# Patient Record
Sex: Female | Born: 1995 | Race: Black or African American | Hispanic: No | Marital: Single | Smoking: Never smoker
Health system: Southern US, Community
[De-identification: ages and names within clinical notes are randomized; demographics above are authoritative.]

## PROBLEM LIST (undated history)

## (undated) DIAGNOSIS — G43909 Migraine, unspecified, not intractable, without status migrainosus: Secondary | ICD-10-CM

## (undated) DIAGNOSIS — J45909 Unspecified asthma, uncomplicated: Secondary | ICD-10-CM

## (undated) HISTORY — PX: FRACTURE SURGERY: SHX138

---
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
## 2014-01-01 ENCOUNTER — Telehealth (HOSPITAL_BASED_OUTPATIENT_CLINIC_OR_DEPARTMENT_OTHER): Payer: Self-pay

## 2014-01-01 NOTE — ED Provider Notes (Signed)
9:21 AM Flow manager called regarding polytrim for conjunctivitis. It appears that patient was given this mediation in ED however mother called stating they do not have rx and this was not given to them at discharge. Chart reviewed. Nurse manager to call in rx for Polytrim drops, 1 drop in each eye, q3hrs while awake. Disp 1 bottle.   Renne CriglerJoshua Joss Friedel, PA-C 01/01/14 [phone]

## 2014-01-01 NOTE — Telephone Encounter (Signed)
Pt's mom called she does not have Rx for Polytrim eye drops asked mother if pt came home with a bottle of eye drops from the ED and she stated no only DC paperwork. Spoke w/ Rhea BleacherJosh Geiple PA  And Rx given verbally to call in for pt.  "Polytrim Eye drops 1gtt each eye x 7days, 1 bottle no refills" Rx called to [facility] [phone].

## 2014-01-02 ENCOUNTER — Encounter (HOSPITAL_COMMUNITY): Payer: Self-pay | Admitting: Emergency Medicine

## 2014-01-02 ENCOUNTER — Emergency Department (HOSPITAL_COMMUNITY)
Admission: EM | Admit: 2014-01-02 | Discharge: 2014-01-02 | Disposition: A | Discharge status: Home / Self Care | Payer: Medicaid Other | Attending: Emergency Medicine | Admitting: Emergency Medicine

## 2014-01-02 DIAGNOSIS — Z79899 Other long term (current) drug therapy: Secondary | ICD-10-CM | POA: Insufficient documentation

## 2014-01-02 DIAGNOSIS — H60399 Other infective otitis externa, unspecified ear: Secondary | ICD-10-CM | POA: Insufficient documentation

## 2014-01-02 DIAGNOSIS — G40909 Epilepsy, unspecified, not intractable, without status epilepticus: Secondary | ICD-10-CM | POA: Insufficient documentation

## 2014-01-02 DIAGNOSIS — J45909 Unspecified asthma, uncomplicated: Secondary | ICD-10-CM | POA: Insufficient documentation

## 2014-01-02 DIAGNOSIS — H6090 Unspecified otitis externa, unspecified ear: Secondary | ICD-10-CM

## 2014-01-02 DIAGNOSIS — G43909 Migraine, unspecified, not intractable, without status migrainosus: Secondary | ICD-10-CM | POA: Insufficient documentation

## 2014-01-02 MED ORDER — ACETAMINOPHEN 325 MG PO TABS
650.0000 mg | ORAL_TABLET | Freq: Once | ORAL | Status: AC
  Administered 2014-01-02: 650 mg via ORAL
  Filled 2014-01-02: qty 2

## 2014-01-02 MED ORDER — CIPROFLOXACIN-DEXAMETHASONE 0.3-0.1 % OT SUSP: 4.0000 [drp] | Freq: Two times a day (BID) | OTIC | Status: DC

## 2014-01-02 NOTE — ED Provider Notes (Signed)
CSN: [id number]     Arrival date & time 01/02/14  1737 History   This chart was scribed for Sharilyn SitesLisa Carmell Elgin, PA working with Leonette Mostharles B. Bernette MayersSheldon, MD, by Bronson CurbJacqueline Melvin, ED Scribe. This patient was seen in room WTR8/WTR8 and the patient's care was started at 5:46 PM.    Chief Complaint  Patient presents with  . Otalgia    left     The history is provided by the patient. No language interpreter was used.   HPI Comments: Shelby Pittman is a 18 y.o. female who presents to the Emergency Department complaining of left ear pain that began 2 days ago. Patient states she was seen here 2 days ago for left ear pain, and was given ear drops (auralgan) with no relief. Patient states she feels as if her left ear is "clogged".  States there is some associated ear drainage-- states it looked like pus.  Denies fevers or chills.  States her hearing seems a bit muffled, but largely normal.    Past Medical History  Diagnosis Date  . Asthma   . Migraine   . Seizures    Past Surgical History  Procedure Laterality Date  . Fracture surgery     Family History  Problem Relation Age of Onset  . Hypertension Mother   . Hypertension Other   . Diabetes Other    History  Substance Use Topics  . Smoking status: Never Smoker   . Smokeless tobacco: Not on file  . Alcohol Use: No   OB History   Grav Para Term Preterm Abortions TAB SAB Ect Mult Living                 Review of Systems  Constitutional: Negative for fever.  HENT: Positive for ear discharge, ear pain and hearing loss.   All other systems reviewed and are negative.     Allergies  Review of patient's allergies indicates no known allergies.  Home Medications   Prior to Admission medications   Medication Sig Start Date End Date Taking? Authorizing Provider  albuterol (PROVENTIL HFA;VENTOLIN HFA) 108 (90 BASE) MCG/ACT inhaler Inhale 2 puffs into the lungs every 6 (six) hours as needed for wheezing.    Historical Provider, MD   antipyrine-benzocaine Lyla Son(AURALGAN) otic solution Place 3-4 drops into the left ear every 2 (two) hours as needed for ear pain. 12/31/13   Roxy Horsemanobert Browning, PA-C  Bepotastine Besilate (BEPREVE) 1.5 % SOLN Place 1 drop into both eyes daily.    Historical Provider, MD  budesonide-formoterol (SYMBICORT) 160-4.5 MCG/ACT inhaler Inhale 2 puffs into the lungs every evening.    Historical Provider, MD  cetirizine (ZYRTEC) 10 MG tablet Take 10 mg by mouth every evening.     Historical Provider, MD  etonogestrel (IMPLANON) 68 MG IMPL implant Inject 1 each into the skin once. Received implant 01/2013    Historical Provider, MD  imipramine (TOFRANIL) 25 MG tablet Take 75 mg by mouth at bedtime.    Historical Provider, MD  levocetirizine (XYZAL) 5 MG tablet Take 5 mg by mouth every evening.    Historical Provider, MD  montelukast (SINGULAIR) 10 MG tablet Take 10 mg by mouth at bedtime.    Historical Provider, MD  oxymetazoline (AFRIN NASAL SPRAY) 0.05 % nasal spray Place 2 sprays into the nose 2 (two) times daily. 11/03/12   Renne CriglerJoshua Geiple, PA-C  topiramate (TOPAMAX SPRINKLE) 25 MG capsule Take 100 mg by mouth 2 (two) times daily.     Historical Provider, MD  Triage Vitals: BP 118/76  Pulse 72  Temp(Src) 98.6 F (37 C) (Oral)  Resp 16  Ht 4\' 11"  (1.499 m)  Wt 130 lb (58.968 kg)  BMI 26.24 kg/m2  SpO2 100%  Physical Exam  Nursing note and vitals reviewed. Constitutional: She is oriented to person, place, and time. She appears well-developed and well-nourished. No distress.  HENT:  Head: Normocephalic and atraumatic.  Right Ear: Tympanic membrane and ear canal normal.  Left Ear: Tympanic membrane normal.  Nose: Nose normal.  Mouth/Throat: Oropharynx is clear and moist.  Left EAC erythematous and mildly swollen without drainage present; TM normal without signs of perforation; no mastoid tenderness; no facial swelling; no notable hearing loss  Eyes: Conjunctivae and EOM are normal. Pupils are equal, round,  and reactive to light.  Neck: Normal range of motion. Neck supple.  Cardiovascular: Normal rate, regular rhythm and normal heart sounds.   Pulmonary/Chest: Effort normal and breath sounds normal. No respiratory distress. She has no wheezes.  Musculoskeletal: Normal range of motion.  Neurological: She is alert and oriented to person, place, and time.  Skin: Skin is warm and dry. She is not diaphoretic.  Psychiatric: She has a normal mood and affect.    ED Course  Procedures (including critical care time)  DIAGNOSTIC STUDIES: Oxygen Saturation is 98% on room air, normal by my interpretation.    COORDINATION OF CARE: At 1748 Discussed treatment plan with patient which includes antibiotic drops. Patient agrees.   Labs Review Labs Reviewed - No data to display  Imaging Review No results found.   EKG Interpretation None      MDM   Final diagnoses:  Otitis externa   PE findings consistent with OE, TM normal.  Hearing WNL on my exam.  Will start on ciprodex.  Instructed she may continue auralgan drops, tylenol/motrin for comfort.  Discussed plan with patient, he/she acknowledged understanding and agreed with plan of care.  Return precautions given for new or worsening symptoms.  I personally performed the services described in this documentation, which was scribed in my presence. The recorded information has been reviewed and is accurate.  Garlon HatchetLisa M Jahiem Franzoni, PA-C 01/02/14 [id number]

## 2014-01-02 NOTE — Discharge Instructions (Signed)
Take the prescribed medication as directed.  May continue using benzocaine drops for comfort. Follow-up with your primary care physician.  If you do not have a primary care physician see resource guide to help find one. Return to the ED for new or worsening symptoms.

## 2014-01-02 NOTE — ED Provider Notes (Signed)
Medical screening examination/treatment/procedure(s) were performed by non-physician practitioner and as supervising physician I was immediately available for consultation/collaboration.   EKG Interpretation None        Charles B. Bernette MayersSheldon, MD 01/02/14 2157
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
## 2014-01-05 NOTE — ED Provider Notes (Signed)
Medical screening examination/treatment/procedure(s) were performed by non-physician practitioner and as supervising physician I was immediately available for consultation/collaboration.   EKG Interpretation None        Audree CamelScott T Tiwana Chavis, MD 01/05/14 1650

## 2014-04-19 ENCOUNTER — Encounter (HOSPITAL_COMMUNITY): Payer: Self-pay | Admitting: Emergency Medicine

## 2014-04-19 ENCOUNTER — Emergency Department (HOSPITAL_COMMUNITY)
Admission: EM | Admit: 2014-04-19 | Discharge: 2014-04-20 | Disposition: A | Discharge status: Home / Self Care | Payer: Medicaid Other | Attending: Emergency Medicine | Admitting: Emergency Medicine

## 2014-04-19 DIAGNOSIS — S5010XA Contusion of unspecified forearm, initial encounter: Secondary | ICD-10-CM | POA: Insufficient documentation

## 2014-04-19 DIAGNOSIS — T71163A Asphyxiation due to hanging, assault, initial encounter: Secondary | ICD-10-CM | POA: Insufficient documentation

## 2014-04-19 DIAGNOSIS — S1093XA Contusion of unspecified part of neck, initial encounter: Secondary | ICD-10-CM | POA: Diagnosis not present

## 2014-04-19 DIAGNOSIS — S0083XA Contusion of other part of head, initial encounter: Secondary | ICD-10-CM | POA: Insufficient documentation

## 2014-04-19 DIAGNOSIS — G43909 Migraine, unspecified, not intractable, without status migrainosus: Secondary | ICD-10-CM | POA: Diagnosis not present

## 2014-04-19 DIAGNOSIS — G40909 Epilepsy, unspecified, not intractable, without status epilepticus: Secondary | ICD-10-CM | POA: Insufficient documentation

## 2014-04-19 DIAGNOSIS — S0990XA Unspecified injury of head, initial encounter: Secondary | ICD-10-CM | POA: Diagnosis present

## 2014-04-19 DIAGNOSIS — S40019A Contusion of unspecified shoulder, initial encounter: Secondary | ICD-10-CM | POA: Diagnosis not present

## 2014-04-19 DIAGNOSIS — S0003XA Contusion of scalp, initial encounter: Secondary | ICD-10-CM | POA: Diagnosis not present

## 2014-04-19 DIAGNOSIS — Z79899 Other long term (current) drug therapy: Secondary | ICD-10-CM | POA: Diagnosis not present

## 2014-04-19 DIAGNOSIS — J45909 Unspecified asthma, uncomplicated: Secondary | ICD-10-CM | POA: Insufficient documentation

## 2014-04-19 DIAGNOSIS — S40029A Contusion of unspecified upper arm, initial encounter: Secondary | ICD-10-CM | POA: Insufficient documentation

## 2014-04-19 DIAGNOSIS — S40021A Contusion of right upper arm, initial encounter: Secondary | ICD-10-CM
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
## 2014-04-19 NOTE — ED Notes (Signed)
[employer] spoke with patient in reference to legal actions to take.
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
## 2014-04-20 MED ORDER — IBUPROFEN 200 MG PO TABS
600.0000 mg | ORAL_TABLET | Freq: Once | ORAL | Status: DC
  Filled 2014-04-20: qty 3

## 2014-04-20 MED ORDER — TRAMADOL HCL 50 MG PO TABS
50.0000 mg | ORAL_TABLET | Freq: Once | ORAL | Status: DC
  Filled 2014-04-20: qty 1

## 2014-04-20 MED ORDER — IBUPROFEN 100 MG/5ML PO SUSP
600.0000 mg | Freq: Once | ORAL | Status: AC
  Administered 2014-04-20: 600 mg via ORAL
  Filled 2014-04-20: qty 30
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
## 2014-04-20 NOTE — Discharge Instructions (Signed)
Contusion °A contusion is a deep bruise. Contusions are the result of an injury that caused bleeding under the skin. The contusion may turn blue, purple, or yellow. Minor injuries will give you a painless contusion, but more severe contusions may stay painful and swollen for a few weeks.  °CAUSES  °A contusion is usually caused by a blow, trauma, or direct force to an area of the body. °SYMPTOMS  °· Swelling and redness of the injured area. °· Bruising of the injured area. °· Tenderness and soreness of the injured area. °· Pain. °DIAGNOSIS  °The diagnosis can be made by taking a history and physical exam. An X-ray, CT scan, or MRI may be needed to determine if there were any associated injuries, such as fractures. °TREATMENT  °Specific treatment will depend on what area of the body was injured. In general, the best treatment for a contusion is resting, icing, elevating, and applying cold compresses to the injured area. Over-the-counter medicines may also be recommended for pain control. Ask your caregiver what the best treatment is for your contusion. °HOME CARE INSTRUCTIONS  °· Put ice on the injured area. °· Put ice in a plastic bag. °· Place a towel between your skin and the bag. °· Leave the ice on for 15-20 minutes, 3-4 times a day, or as directed by your health care provider. °· Only take over-the-counter or prescription medicines for pain, discomfort, or fever as directed by your caregiver. Your caregiver may recommend avoiding anti-inflammatory medicines (aspirin, ibuprofen, and naproxen) for 48 hours because these medicines may increase bruising. °· Rest the injured area. °· If possible, elevate the injured area to reduce swelling. °SEEK IMMEDIATE MEDICAL CARE IF:  °· You have increased bruising or swelling. °· You have pain that is getting worse. °· Your swelling or pain is not relieved with medicines. °MAKE SURE YOU:  °· Understand these instructions. °· Will watch your condition. °· Will get help right  away if you are not doing well or get worse. °Document Released: 05/24/2005 Document Revised: 08/19/2013 Document Reviewed: 06/19/2011 °ExitCare® Patient Information ©2015 [employer]. This information is not intended to replace advice given to you by your health care provider. Make sure you discuss any questions you have with your health care provider. ° ° °Head Injury °You have received a head injury. It does not appear serious at this time. Headaches and vomiting are common following head injury. It should be easy to awaken from sleeping. Sometimes it is necessary for you to stay in the emergency department for a while for observation. Sometimes admission to the hospital may be needed. After injuries such as yours, most problems occur within the first 24 hours, but side effects may occur up to 7-10 days after the injury. It is important for you to carefully monitor your condition and contact your health care provider or seek immediate medical care if there is a change in your condition. °WHAT ARE THE TYPES OF HEAD INJURIES? °Head injuries can be as minor as a bump. Some head injuries can be more severe. More severe head injuries include: °· A jarring injury to the brain (concussion). °· A bruise of the brain (contusion). This mean there is bleeding in the brain that can cause swelling. °· A cracked skull (skull fracture). °· Bleeding in the brain that collects, clots, and forms a bump (hematoma). °WHAT CAUSES A HEAD INJURY? °A serious head injury is most likely to happen to someone who is in a car wreck and is not   wearing a seat belt. Other causes of major head injuries include bicycle or motorcycle accidents, sports injuries, and falls. °HOW ARE HEAD INJURIES DIAGNOSED? °A complete history of the event leading to the injury and your current symptoms will be helpful in diagnosing head injuries. Many times, pictures of the brain, such as CT or MRI are needed to see the extent of the injury. Often, an overnight  hospital stay is necessary for observation.  °WHEN SHOULD I SEEK IMMEDIATE MEDICAL CARE?  °You should get help right away if: °· You have confusion or drowsiness. °· You feel sick to your stomach (nauseous) or have continued, forceful vomiting. °· You have dizziness or unsteadiness that is getting worse. °· You have severe, continued headaches not relieved by medicine. Only take over-the-counter or prescription medicines for pain, fever, or discomfort as directed by your health care provider. °· You do not have normal function of the arms or legs or are unable to walk. °· You notice changes in the black spots in the center of the colored part of your eye (pupil). °· You have a clear or bloody fluid coming from your nose or ears. °· You have a loss of vision. °During the next 24 hours after the injury, you must stay with someone who can watch you for the warning signs. This person should contact local emergency services (911 in the [place] if you have seizures, you become unconscious, or you are unable to wake up. °HOW CAN I PREVENT A HEAD INJURY IN THE FUTURE? °The most important factor for preventing major head injuries is avoiding motor vehicle accidents.  To minimize the potential for damage to your head, it is crucial to wear seat belts while riding in motor vehicles. Wearing helmets while bike riding and playing collision sports (like football) is also helpful. Also, avoiding dangerous activities around the house will further help reduce your risk of head injury.  °WHEN CAN I RETURN TO NORMAL ACTIVITIES AND ATHLETICS? °You should be reevaluated by your health care provider before returning to these activities. If you have any of the following symptoms, you should not return to activities or contact sports until 1 week after the symptoms have stopped: °· Persistent headache. °· Dizziness or vertigo. °· Poor attention and concentration. °· Confusion. °· Memory problems. °· Nausea or vomiting. °· Fatigue or tire  easily. °· Irritability. °· Intolerant of bright lights or loud noises. °· Anxiety or depression. °· Disturbed sleep. °MAKE SURE YOU:  °· Understand these instructions. °· Will watch your condition. °· Will get help right away if you are not doing well or get worse. °Document Released: 08/14/2005 Document Revised: 08/19/2013 Document Reviewed: 04/21/2013 °ExitCare® Patient Information ©2015 [employer]. This information is not intended to replace advice given to you by your health care provider. Make sure you discuss any questions you have with your health care provider. ° °

## 2014-10-11 ENCOUNTER — Emergency Department (HOSPITAL_COMMUNITY)
Admission: EM | Admit: 2014-10-11 | Discharge: 2014-10-11 | Discharge status: Absent without leave | Payer: No Typology Code available for payment source

## 2015-03-17 ENCOUNTER — Encounter (HOSPITAL_COMMUNITY): Payer: Self-pay | Admitting: Emergency Medicine

## 2015-03-17 ENCOUNTER — Emergency Department (HOSPITAL_COMMUNITY)
Admission: EM | Admit: 2015-03-17 | Discharge: 2015-03-17 | Disposition: A | Discharge status: Home / Self Care | Payer: Medicaid Other | Attending: Emergency Medicine | Admitting: Emergency Medicine

## 2015-03-17 ENCOUNTER — Emergency Department (HOSPITAL_COMMUNITY): Payer: Medicaid Other

## 2015-03-17 ENCOUNTER — Emergency Department (HOSPITAL_COMMUNITY): Payer: Self-pay

## 2015-03-17 DIAGNOSIS — Y9389 Activity, other specified: Secondary | ICD-10-CM | POA: Insufficient documentation

## 2015-03-17 DIAGNOSIS — Z7951 Long term (current) use of inhaled steroids: Secondary | ICD-10-CM | POA: Insufficient documentation

## 2015-03-17 DIAGNOSIS — G43909 Migraine, unspecified, not intractable, without status migrainosus: Secondary | ICD-10-CM | POA: Insufficient documentation

## 2015-03-17 DIAGNOSIS — J45909 Unspecified asthma, uncomplicated: Secondary | ICD-10-CM | POA: Insufficient documentation

## 2015-03-17 DIAGNOSIS — S79912A Unspecified injury of left hip, initial encounter: Secondary | ICD-10-CM | POA: Insufficient documentation

## 2015-03-17 DIAGNOSIS — R52 Pain, unspecified: Secondary | ICD-10-CM

## 2015-03-17 DIAGNOSIS — Y998 Other external cause status: Secondary | ICD-10-CM | POA: Insufficient documentation

## 2015-03-17 DIAGNOSIS — S29001A Unspecified injury of muscle and tendon of front wall of thorax, initial encounter: Secondary | ICD-10-CM | POA: Insufficient documentation

## 2015-03-17 DIAGNOSIS — Z79899 Other long term (current) drug therapy: Secondary | ICD-10-CM | POA: Insufficient documentation

## 2015-03-17 DIAGNOSIS — Y92410 Unspecified street and highway as the place of occurrence of the external cause: Secondary | ICD-10-CM | POA: Insufficient documentation

## 2015-03-17 LAB — CBC
HCT: 42.2 % (ref 36.0–46.0)
Hemoglobin: 13.8 g/dL (ref 12.0–15.0)
MCH: 29.9 pg (ref 26.0–34.0)
MCHC: 32.7 g/dL (ref 30.0–36.0)
MCV: 91.3 fL (ref 78.0–100.0)
Platelets: 249 10*3/uL (ref 150–400)
RBC: 4.62 MIL/uL (ref 3.87–5.11)
RDW: 12.9 % (ref 11.5–15.5)
WBC: 5.9 10*3/uL (ref 4.0–10.5)

## 2015-03-17 LAB — BASIC METABOLIC PANEL
Anion gap: 9 (ref 5–15)
BUN: 13 mg/dL (ref 6–20)
CO2: 27 mmol/L (ref 22–32)
CREATININE: 0.92 mg/dL (ref 0.44–1.00)
Calcium: 9.3 mg/dL (ref 8.9–10.3)
Chloride: 105 mmol/L (ref 101–111)
GFR calc non Af Amer: >60 mL/min (ref >60)
Glucose, Bld: 62 mg/dL — ABNORMAL LOW (ref 65–99)
POTASSIUM: 3.9 mmol/L (ref 3.5–5.1)
Sodium: 141 mmol/L (ref 135–145)

## 2015-03-17 MED ORDER — OXYCODONE-ACETAMINOPHEN 5-325 MG PO TABS: 2.0000 | ORAL_TABLET | ORAL | Status: DC | PRN

## 2015-03-17 MED ORDER — METHOCARBAMOL 500 MG PO TABS: 500.0000 mg | ORAL_TABLET | Freq: Two times a day (BID) | ORAL | Status: DC
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
## 2015-03-17 NOTE — ED Notes (Signed)
Patient transported to X-ray 
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
## 2015-03-17 NOTE — ED Notes (Signed)
Pt tried to use restroom but could not go.

## 2015-03-17 NOTE — Discharge Instructions (Signed)

## 2016-06-15 ENCOUNTER — Encounter (HOSPITAL_COMMUNITY): Payer: Self-pay | Admitting: Emergency Medicine

## 2016-06-15 ENCOUNTER — Emergency Department (HOSPITAL_COMMUNITY)
Admission: EM | Admit: 2016-06-15 | Discharge: 2016-06-15 | Disposition: A | Discharge status: Home / Self Care | Payer: Medicaid Other | Attending: Emergency Medicine | Admitting: Emergency Medicine

## 2016-06-15 DIAGNOSIS — J069 Acute upper respiratory infection, unspecified: Secondary | ICD-10-CM | POA: Insufficient documentation

## 2016-06-15 DIAGNOSIS — J45909 Unspecified asthma, uncomplicated: Secondary | ICD-10-CM | POA: Insufficient documentation

## 2016-06-15 MED ORDER — PROMETHAZINE-DM 6.25-15 MG/5ML PO SYRP: 5.0000 mL | ORAL_SOLUTION | Freq: Four times a day (QID) | ORAL | 0 refills | Status: DC | PRN

## 2016-06-15 MED ORDER — GUAIFENESIN 100 MG/5ML PO LIQD
100.0000 mg | Freq: Four times a day (QID) | ORAL | 0 refills | Status: DC | PRN
Start: 2016-06-15 — End: 2017-03-13

## 2016-06-15 NOTE — ED Triage Notes (Signed)
Pt reports runny nose, chest congestion, hot/cold flashes, and weakness for the past week. Ambulatory to triage.

## 2016-06-15 NOTE — ED Provider Notes (Signed)
[facility]-EMERGENCY DEPT Provider Note   CSN: [id number] Arrival date & time: 06/15/16  1028     History   Chief Complaint Chief Complaint  Patient presents with  . Nasal Congestion    HPI Shelby Pittman is a 20 y.o. female.  HPI   20 year old female with history of seizure, asthma, migraine presenting with cold symptoms. Patient report for the past 6 days she has had headache, nasal congestion, sore throat, ear pain, sneezing, coughing, decrease in appetite, body aches. 4 days ago she vomited 3 times of nonbloody nonbilious vomitus and has some loose stools but that has since resolved. The symptom is improving. She has been trying over-the-counter medication with some improvement. Currently she denies having any fever, chills.  No recent sick contact.    Past Medical History:  Diagnosis Date  . Asthma   . Migraine   . Seizures (HCC)     There are no active problems to display for this patient.   Past Surgical History:  Procedure Laterality Date  . FRACTURE SURGERY      OB History    No data available       Home Medications    Prior to Admission medications   Medication Sig Start Date End Date Taking? Authorizing Provider  albuterol (PROVENTIL HFA;VENTOLIN HFA) 108 (90 BASE) MCG/ACT inhaler Inhale 2 puffs into the lungs every 6 (six) hours as needed for wheezing.    Historical Provider, MD  Bepotastine Besilate (BEPREVE) 1.5 % SOLN Place 1 drop into both eyes daily.    Historical Provider, MD  budesonide-formoterol (SYMBICORT) 160-4.5 MCG/ACT inhaler Inhale 2 puffs into the lungs every evening.    Historical Provider, MD  etonogestrel (IMPLANON) 68 MG IMPL implant Inject 1 each into the skin once. Received implant 01/2013    Historical Provider, MD  imipramine (TOFRANIL) 25 MG tablet Take 75 mg by mouth at bedtime.    Historical Provider, MD  methocarbamol (ROBAXIN) 500 MG tablet Take 1 tablet (500 mg total) by mouth 2 (two) times daily. 03/17/15   Lorre Nick, MD    oxyCODONE-acetaminophen (PERCOCET/ROXICET) 5-325 MG per tablet Take 2 tablets by mouth every 4 (four) hours as needed for severe pain. 03/17/15   Lorre Nick, MD  topiramate (TOPAMAX SPRINKLE) 25 MG capsule Take 100 mg by mouth 2 (two) times daily.     Historical Provider, MD    Family History Family History  Problem Relation Age of Onset  . Hypertension Mother   . Hypertension Other   . Diabetes Other     Social History Social History  Substance Use Topics  . Smoking status: Never Smoker  . Smokeless tobacco: Not on file  . Alcohol use No     Allergies   Review of patient's allergies indicates no known allergies.   Review of Systems Review of Systems  All other systems reviewed and are negative.    Physical Exam Updated Vital Signs BP 126/77   Pulse (!) 52   Temp 97.7 F (36.5 C) (Oral)   Resp 16   SpO2 100%   Physical Exam  Constitutional: She appears well-developed and well-nourished. No distress.  HENT:  Head: Atraumatic.  Right Ear: External ear normal.  Left Ear: External ear normal.  Nose: Nose normal.  Mouth/Throat: Oropharynx is clear and moist.  Eyes: Conjunctivae are normal.  Neck: Normal range of motion. Neck supple.  No nuchal rigidity  Cardiovascular: Normal rate, regular rhythm and intact distal pulses.   Pulmonary/Chest: Effort normal and  breath sounds normal.  Abdominal: Soft. Bowel sounds are normal. She exhibits no distension. There is no tenderness.  Lymphadenopathy:    She has no cervical adenopathy.  Neurological: She is alert.  Skin: No rash noted.  Psychiatric: She has a normal mood and affect.  Nursing note and vitals reviewed.    ED Treatments / Results  Labs (all labs ordered are listed, but only abnormal results are displayed) Labs Reviewed - No data to display  EKG  EKG Interpretation None       Radiology No results found.  Procedures Procedures (including critical care time)  Medications Ordered in  ED Medications - No data to display   Initial Impression / Assessment and Plan / ED Course  I have reviewed the triage vital signs and the nursing notes.  Pertinent labs & imaging results that were available during my care of the patient were reviewed by me and considered in my medical decision making (see chart for details).  Clinical Course    BP 126/77   Pulse (!) 52   Temp 97.7 F (36.5 C) (Oral)   Resp 16   SpO2 100%    Final Clinical Impressions(s) / ED Diagnoses   Final diagnoses:  Acute upper respiratory infection    New Prescriptions Discharge Medication List as of 06/15/2016 12:23 PM    START taking these medications   Details  guaiFENesin (ROBITUSSIN) 100 MG/5ML liquid Take 5-10 mLs (100-200 mg total) by mouth 4 (four) times daily as needed for congestion., Starting Thu 06/15/2016, Print    promethazine-dextromethorphan (PROMETHAZINE-DM) 6.25-15 MG/5ML syrup Take 5 mLs by mouth 4 (four) times daily as needed for cough., Starting Thu 06/15/2016, Print       12:03 PM Pt with sxs suggestive of URI.  No concerning finding. Will provide sxs treatment. Return precaution discussed.     Fayrene HelperBowie Mackinley Cassaday, PA-C 06/15/16 2051    Canary Brimhristopher J Tegeler, MD 06/15/16 2104

## 2016-06-15 NOTE — ED Notes (Signed)
PA at bedside.

## 2016-06-15 NOTE — Progress Notes (Signed)
[facility]  Internal Medicine [phone] [address] [id number] [facility] [place] [id number]    Next Steps: Go on 07/24/2016    Instructions: You have a scheduled appointment to establish care with Dr Reymundo Pollarolyn Guilloud on 07/24/16 at 3:15 pm

## 2016-07-24 ENCOUNTER — Encounter: Payer: Medicaid Other | Admitting: Internal Medicine

## 2016-09-08 ENCOUNTER — Emergency Department (HOSPITAL_COMMUNITY)
Admission: EM | Admit: 2016-09-08 | Discharge: 2016-09-08 | Disposition: A | Discharge status: Home / Self Care | Payer: Self-pay | Attending: Emergency Medicine | Admitting: Emergency Medicine

## 2016-09-08 ENCOUNTER — Encounter (HOSPITAL_COMMUNITY): Payer: Self-pay | Admitting: Emergency Medicine

## 2016-09-08 ENCOUNTER — Emergency Department (HOSPITAL_COMMUNITY): Payer: Self-pay

## 2016-09-08 DIAGNOSIS — J45909 Unspecified asthma, uncomplicated: Secondary | ICD-10-CM | POA: Insufficient documentation

## 2016-09-08 DIAGNOSIS — Z79899 Other long term (current) drug therapy: Secondary | ICD-10-CM | POA: Insufficient documentation

## 2016-09-08 DIAGNOSIS — R05 Cough: Secondary | ICD-10-CM | POA: Insufficient documentation

## 2016-09-08 DIAGNOSIS — J029 Acute pharyngitis, unspecified: Secondary | ICD-10-CM | POA: Insufficient documentation

## 2016-09-08 DIAGNOSIS — R059 Cough, unspecified: Secondary | ICD-10-CM

## 2016-09-08 LAB — RAPID STREP SCREEN (MED CTR MEBANE ONLY): Streptococcus, Group A Screen (Direct): NEGATIVE

## 2016-09-08 MED ORDER — IPRATROPIUM-ALBUTEROL 0.5-2.5 (3) MG/3ML IN SOLN
3.0000 mL | Freq: Once | RESPIRATORY_TRACT | Status: AC
  Administered 2016-09-08: 3 mL via RESPIRATORY_TRACT
  Filled 2016-09-08: qty 3

## 2016-09-08 NOTE — ED Notes (Signed)
Unable to obtain discharge vitals pt ready to leave.

## 2016-09-08 NOTE — ED Triage Notes (Signed)
Pt reports swelling and sharp pain to left lower gingiva, reports swelling into left submandibular area, sore throat, painful swallowing.   Tonsils 2+ without exudate. Coarse lung sounds to left upper lobe.  Pt reports recent recovery from cough, yellow nasal drainage persisting.

## 2016-09-08 NOTE — ED Provider Notes (Signed)
[facility]-EMERGENCY DEPT Provider Note   CSN: [id number] Arrival date & time: 09/08/16  1225  By signing my name below, I, Modena Jansky, attest that this documentation has been prepared under the direction and in the presence of non-physician practitioner, Mathews Robinsons, PA-C. Electronically Signed: Modena Jansky, Scribe. 09/08/2016. 1:35 PM.  History   Chief Complaint Chief Complaint  Patient presents with  . Sore Throat   The history is provided by the patient. No language interpreter was used.   HPI Comments: Shelby Pittman is a 21 y.o. female with a PMHx of asthma who presents to the Emergency Department complaining of constant moderate sublingual gum pain that started about 4 days ago. She states recently had a URI with unresolved cough a month ago (no CXR done then), and she recently had a gradual onset of gum pain. Her pain is relieved by massaging area with tongue and exacerbated by talking and swallowing. No treatment PTA. She has associated facial swelling underneath chin. She denies any prior hx of similar complaint, fever, chills, sinus pain, dental pain, SOB, cough, nausea, vomiting, dizziness, or visual disturbance.     PCP: Reymundo Poll, MD  Past Medical History:  Diagnosis Date  . Asthma   . Migraine   . Seizures (HCC)     There are no active problems to display for this patient.   Past Surgical History:  Procedure Laterality Date  . FRACTURE SURGERY      OB History    No data available       Home Medications    Prior to Admission medications   Medication Sig Start Date End Date Taking? Authorizing Provider  albuterol (PROVENTIL HFA;VENTOLIN HFA) 108 (90 BASE) MCG/ACT inhaler Inhale 2 puffs into the lungs every 6 (six) hours as needed for wheezing.    Historical Provider, MD  Bepotastine Besilate (BEPREVE) 1.5 % SOLN Place 1 drop into both eyes daily.    Historical Provider, MD  budesonide-formoterol (SYMBICORT) 160-4.5 MCG/ACT inhaler Inhale 2  puffs into the lungs every evening.    Historical Provider, MD  etonogestrel (IMPLANON) 68 MG IMPL implant Inject 1 each into the skin once. Received implant 01/2013    Historical Provider, MD  guaiFENesin (ROBITUSSIN) 100 MG/5ML liquid Take 5-10 mLs (100-200 mg total) by mouth 4 (four) times daily as needed for congestion. 06/15/16   Fayrene Helper, PA-C  imipramine (TOFRANIL) 25 MG tablet Take 75 mg by mouth at bedtime.    Historical Provider, MD  methocarbamol (ROBAXIN) 500 MG tablet Take 1 tablet (500 mg total) by mouth 2 (two) times daily. 03/17/15   Lorre Nick, MD  oxyCODONE-acetaminophen (PERCOCET/ROXICET) 5-325 MG per tablet Take 2 tablets by mouth every 4 (four) hours as needed for severe pain. 03/17/15   Lorre Nick, MD  promethazine-dextromethorphan (PROMETHAZINE-DM) 6.25-15 MG/5ML syrup Take 5 mLs by mouth 4 (four) times daily as needed for cough. 06/15/16   Fayrene Helper, PA-C  topiramate (TOPAMAX SPRINKLE) 25 MG capsule Take 100 mg by mouth 2 (two) times daily.     Historical Provider, MD    Family History Family History  Problem Relation Age of Onset  . Hypertension Mother   . Hypertension Other   . Diabetes Other     Social History Social History  Substance Use Topics  . Smoking status: Never Smoker  . Smokeless tobacco: Not on file  . Alcohol use No     Allergies   Patient has no known allergies.   Review of Systems Review of  Systems  Constitutional: Negative for chills and fever.  HENT: Positive for facial swelling (Under chin) and sore throat. Negative for dental problem, ear pain, mouth sores and sinus pain.        Patient reports sublingual tenderness and swelling under her chin.  Eyes: Negative for pain and visual disturbance.  Respiratory: Positive for cough and wheezing. Negative for chest tightness, shortness of breath and stridor.   Gastrointestinal: Negative for abdominal distention, abdominal pain, nausea and vomiting.  Musculoskeletal: Negative for gait  problem, myalgias, neck pain and neck stiffness.  Skin: Negative for color change, pallor, rash and wound.  Neurological: Negative for dizziness, syncope, facial asymmetry, speech difficulty, weakness and numbness.     Physical Exam Updated Vital Signs BP 119/83 (BP Location: Right Arm)   Pulse (!) 58   Temp 97.7 F (36.5 C) (Oral)   Resp 16   SpO2 100%   Physical Exam  Constitutional: She appears well-developed and well-nourished. No distress.  Nontoxic appearing in no acute distress, sitting comfortably in bed  HENT:  Head: Normocephalic and atraumatic.  Mouth/Throat: Oropharynx is clear and moist. No oropharyngeal exudate.  Sublingual area is raised. TTP on the left sublingual and gums in the front. Swelling noted underneath chin.   Eyes: EOM are normal. Pupils are equal, round, and reactive to light. Right eye exhibits no discharge. Left eye exhibits no discharge.  Neck: Normal range of motion. Neck supple. No tracheal deviation present.  Cardiovascular: Normal rate, regular rhythm, normal heart sounds and intact distal pulses.   Pulmonary/Chest: Effort normal. No respiratory distress. She has wheezes. She has no rales. She exhibits no tenderness.  Wheezing bilaterally.   Abdominal: Soft. She exhibits no distension. There is no tenderness. There is no guarding.  Musculoskeletal: Normal range of motion. She exhibits no edema or deformity.  Neurological: She is alert.  Skin: Skin is warm and dry. No rash noted. She is not diaphoretic. No erythema. No pallor.  Psychiatric: She has a normal mood and affect. Her behavior is normal.  Nursing note and vitals reviewed.    ED Treatments / Results  DIAGNOSTIC STUDIES: Oxygen Saturation is 100% on RA, normal by my interpretation.    COORDINATION OF CARE: 1:39 PM- Pt advised of plan for treatment and pt agrees.  Labs (all labs ordered are listed, but only abnormal results are displayed) Labs Reviewed  RAPID STREP SCREEN (NOT AT  [facility])  CULTURE, GROUP A STREP [facility])    EKG  EKG Interpretation None       Radiology Dg Chest 2 View  Result Date: 09/08/2016 CLINICAL DATA:  Cough and chest pain EXAM: CHEST  2 VIEW COMPARISON:  March 17, 2015 FINDINGS: Lungs are clear. Heart size and pulmonary vascularity are normal. No adenopathy. No pneumothorax. No bone lesions. IMPRESSION: No edema or consolidation. Electronically Signed   By: Bretta BangWilliam  Woodruff III M.D.   On: 09/08/2016 14:47    Procedures Procedures (including critical care time)  Medications Ordered in ED Medications  ipratropium-albuterol (DUONEB) 0.5-2.5 (3) MG/3ML nebulizer solution 3 mL (3 mLs Nebulization Given 09/08/16 1404)     Initial Impression / Assessment and Plan / ED Course  I have reviewed the triage vital signs and the nursing notes.  Pertinent labs & imaging results that were available during my care of the patient were reviewed by me and considered in my medical decision making (see chart for details).  Clinical Course   Otherwise healthy 21 y/o female presenting with sore throat and  sublingual tenderness and mild swelling under her chin. Patient initially had wheezing on exam. Reported a cough that was unresolved for over a month. Obtain chest x-ray and ordered nebulizing treatment.Patient reported significant improvement after her nebulizing treatment. Chest x-ray was negative for any acute cardiopulmonary processes. Rapid strep is negative.  Dr. Clydene Pugh has also seen patient and assessed her sublingual swelling and under her chin swelling. Reassuring exam non-concerning for Ludwig angina. She did not have any dental pain or evidence of abscess. More likely to be a salivary duct obstruction. Recommended sour candies to encourage secretion from glands. She was tolerating her oral secretions well.  Patient improved while in the Ed. She was ready to go home and felt like her breathing was much better. Discharge home with symptomatic relief  and close follow up with PCP. Patient has her own inhaler at home.  Discussed strict return precautions. Patient was advised to return to the emergency department if experiencing any worsening of symptoms. Patient understood instructions and agreed with discharge plan.  Patient was also seen by supervising Physician who agrees with assessment and plan.  Final Clinical Impressions(s) / ED Diagnoses   Final diagnoses:  Cough  Sore throat    New Prescriptions Discharge Medication List as of 09/08/2016  4:14 PM     I personally performed the services described in this documentation, which was scribed in my presence. The recorded information has been reviewed and is accurate.    Georgiana Shore, PA-C 09/08/16 2357    Lyndal Pulley, MD 09/09/16 [phone]

## 2016-09-10 LAB — CULTURE, GROUP A STREP (THRC)

## 2016-09-27 ENCOUNTER — Telehealth: Payer: Self-pay | Admitting: Internal Medicine

## 2016-09-27 NOTE — Telephone Encounter (Signed)
APT. REMINDER CALL, LMTCB °

## 2016-09-28 ENCOUNTER — Ambulatory Visit (INDEPENDENT_AMBULATORY_CARE_PROVIDER_SITE_OTHER): Payer: Self-pay | Admitting: Internal Medicine

## 2016-09-28 ENCOUNTER — Encounter: Payer: Self-pay | Admitting: Internal Medicine

## 2016-09-28 VITALS — BP 114/60 | HR 70 | Temp 98.1°F | Wt 154.1 lb

## 2016-09-28 DIAGNOSIS — J45909 Unspecified asthma, uncomplicated: Secondary | ICD-10-CM

## 2016-09-28 DIAGNOSIS — J4520 Mild intermittent asthma, uncomplicated: Secondary | ICD-10-CM

## 2016-09-28 DIAGNOSIS — Z789 Other specified health status: Secondary | ICD-10-CM

## 2016-09-28 DIAGNOSIS — Z975 Presence of (intrauterine) contraceptive device: Secondary | ICD-10-CM

## 2016-09-28 DIAGNOSIS — Z716 Tobacco abuse counseling: Secondary | ICD-10-CM | POA: Insufficient documentation

## 2016-09-28 DIAGNOSIS — J3089 Other allergic rhinitis: Secondary | ICD-10-CM

## 2016-09-28 DIAGNOSIS — Z8669 Personal history of other diseases of the nervous system and sense organs: Secondary | ICD-10-CM

## 2016-09-28 DIAGNOSIS — Z91120 Patient's intentional underdosing of medication regimen due to financial hardship: Secondary | ICD-10-CM

## 2016-09-28 DIAGNOSIS — J309 Allergic rhinitis, unspecified: Secondary | ICD-10-CM | POA: Insufficient documentation

## 2016-09-28 DIAGNOSIS — F17210 Nicotine dependence, cigarettes, uncomplicated: Secondary | ICD-10-CM

## 2016-09-28 DIAGNOSIS — Z8249 Family history of ischemic heart disease and other diseases of the circulatory system: Secondary | ICD-10-CM

## 2016-09-28 HISTORY — DX: Other specified health status: Z78.9

## 2016-09-28 MED ORDER — CETIRIZINE HCL 10 MG PO TABS: 10.0000 mg | ORAL_TABLET | Freq: Every day | ORAL | 0 refills | Status: DC

## 2016-09-28 MED ORDER — FLUTICASONE PROPIONATE 50 MCG/ACT NA SUSP: 2.0000 | Freq: Every day | NASAL | 2 refills | Status: DC

## 2016-09-28 MED ORDER — FLUTICASONE-SALMETEROL 250-50 MCG/DOSE IN AEPB: 1.0000 | INHALATION_SPRAY | Freq: Two times a day (BID) | RESPIRATORY_TRACT | 2 refills | Status: DC

## 2016-09-28 MED ORDER — ALBUTEROL SULFATE HFA 108 (90 BASE) MCG/ACT IN AERS: 2.0000 | INHALATION_SPRAY | Freq: Four times a day (QID) | RESPIRATORY_TRACT | 2 refills | Status: DC | PRN

## 2016-09-28 MED FILL — ADVAIR 250/50 DISKUS: 250-50 | 30 days supply | Qty: 60 | Fill #0

## 2016-09-28 MED FILL — FLUTICASONE PROP 50 MCG SPR: 50 | 30 days supply | Qty: 16 | Fill #0

## 2016-09-28 MED FILL — VENTOLIN HFA 90 MCG INHALER: 108 (90 BAS | 25 days supply | Qty: 18 | Fill #0

## 2016-09-28 NOTE — Progress Notes (Signed)
   CC: establish care, asthma  HPI:  [name] is a very pleasant 21 y.o. F new to the clinic with Mhx significant for asthma who presents to establish care. Details of her chronic medical problems are described below. 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  Asthma: Diagnosed when she was a child. Was prescribed Symbicort for many years and had good control of her symptoms with this regimen. Unfortunately, she has been unable to afford refills of this medication so she has been using her PRN Albuterol inhaler 2-4 times daily.   Migraines: Diagnosed with migraines when she was 403-22 years old. Was managed with Topamax however this has been discontinued as she hasn't had a migraine in approximately 10 years, per patient.   Seizures: History of 1 febrile seizure when she was young. Reportedly has not had any since approximately age 89.   Other health maintenance: Had Nexplanon implanted approximately 3.5 years ago - was due for removal in September 2017. Currently using back up birth control. Also reports some weight gain over the past several years -- attributes this to eating junk food due to her busy schedule.   Tobacco abuse: Smokes "3-4 cigarettes" per day and interested in cessation at this time.   Past Medical History:  Diagnosis Date  . Asthma   . Migraine   . Seizures (HCC)     Review of Systems:  Review of Systems  Constitutional: Negative for chills, fever and weight loss (weight gain, junk food).  HENT: Positive for congestion and tinnitus. Negative for ear discharge, nosebleeds and sinus pain.   Eyes: Positive for discharge (watery, itchy eyes) and redness.  Respiratory: Positive for cough and wheezing. Negative for sputum production and shortness of breath.   Cardiovascular: Negative for chest pain and leg swelling.  Gastrointestinal: Negative for abdominal pain, nausea and vomiting.    Neurological: Negative for dizziness, tingling and headaches.  Psychiatric/Behavioral: Negative for depression. The patient is not nervous/anxious.    Physical Exam: Physical Exam  Constitutional: She appears well-developed and well-nourished.  HENT:  Head: Normocephalic and atraumatic.  Cardiovascular: Normal rate, regular rhythm and normal heart sounds.   Pulmonary/Chest: Effort normal and breath sounds normal. No respiratory distress.  Abdominal: Soft. Bowel sounds are normal. She exhibits no distension.   Vitals:   09/28/16 0839  BP: 114/60  Pulse: 70  Temp: 98.1 F (36.7 C)  TempSrc: Oral  SpO2: 100%  Weight: 154 lb 1.6 oz (69.9 kg)    Assessment & Plan:   See Encounters Tab for problem based charting.  Patient discussed with Dr. Criselda PeachesMullen

## 2016-09-28 NOTE — Assessment & Plan Note (Signed)
Was previously well controlled with Symbicort however has not been able to afford recently. Has been instead using her albuterol rescue inhaler 2-4 times a day. Have prescribed Advair and PRN albuterol

## 2016-09-28 NOTE — Assessment & Plan Note (Signed)
Smokes 3-4 cigarettes per day for several years. Interested in quitting and was encouraged strongly to stop. Smoking cessation counseling provided and encouraged patient to keep in touch about this so we may assist if needed

## 2016-09-28 NOTE — Assessment & Plan Note (Signed)
Due for removal 04/2016. Currently using back up methods of birth control. Interested in removing it and getting an IUD placed. Have referred to GYN for assistance with this. Also, patient would like a PAP. Offered here, but wanted to see GYN.

## 2016-09-28 NOTE — Patient Instructions (Addendum)
It was an absolute pleasure meeting you today! Thank you for coming to the [facility] Medicine Clinic for your primary care needs.   1. Today we talked about your asthma. For this I am prescribing you a daily inhaler which is similar to the Symbicort you use to have. This is called Advair and you will take it twice a day. I am also sending in a prescription for your Albuterol. Please use this one only as needed.  2. Today we also talked about your allergies. For this I am prescribing you 2 things: Flonase and Cetirizine (Zyrtec). The outpatient pharmacy may not cover these medicines as they are over the counter however please try to obtain them.  3. For your Nexplanon implant, I am referring you to OB/GYN - they have the tools needed to remove this device. Please discuss getting a PAP and alternative birth control at that appointment. Until that time, please use a back up method of birth control.  4. Please try to quit smoking!  5. See us again in 3 months or sooner if needed!

## 2016-09-28 NOTE — Assessment & Plan Note (Signed)
Has tried Zyrtec before however has not been compliant with this medication in several months. Endorses watery, itchy eyes, runny nose and sneezing. Have suggested Flonase and Zyrtec.

## 2016-10-02 ENCOUNTER — Encounter: Payer: Self-pay | Admitting: Family Medicine

## 2016-10-03 ENCOUNTER — Telehealth: Payer: Self-pay | Admitting: Internal Medicine

## 2016-10-03 NOTE — Progress Notes (Signed)
Internal Medicine Clinic Attending  Case discussed with Dr. Vincente LibertyMolt at the time of the visit.  We reviewed the resident's history and exam and pertinent patient test results.  I agree with the assessment, diagnosis, and plan of care documented in the resident's note.  Family History: Mom with HTN

## 2016-10-03 NOTE — Telephone Encounter (Signed)
Please call patient back in reference to her Allergy medication

## 2016-10-03 NOTE — Telephone Encounter (Signed)
Called pt back, mailbox full- could not leave message

## 2016-10-04 NOTE — Telephone Encounter (Signed)
Got same message today, mb full, unable to leave message

## 2016-10-09 NOTE — Telephone Encounter (Signed)
Got same message, closed

## 2016-10-11 ENCOUNTER — Other Ambulatory Visit: Payer: Self-pay | Admitting: Internal Medicine

## 2016-10-11 ENCOUNTER — Telehealth: Payer: Self-pay

## 2016-10-11 MED ORDER — MONTELUKAST SODIUM 10 MG PO TABS: 10.0000 mg | ORAL_TABLET | Freq: Every day | ORAL | 2 refills | Status: DC

## 2016-10-11 MED FILL — MONTELUKAST SOD 10 MG TAB: 10 | 30 days supply | Qty: 30 | Fill #0

## 2016-10-11 NOTE — Telephone Encounter (Signed)
Needs to speak with a nurse regarding meds. Please call back.  

## 2016-10-11 NOTE — Telephone Encounter (Signed)
Wants singulair, states her dermatologist ordered it last year, this is per mother of pt, informed she would need to call the dermatology or have appt here in clinic, appt given for 2/19 at [facility]0815

## 2016-10-16 ENCOUNTER — Encounter: Payer: Self-pay | Admitting: Internal Medicine

## 2016-10-16 ENCOUNTER — Ambulatory Visit: Payer: Medicaid Other

## 2016-10-30 ENCOUNTER — Encounter: Payer: Medicaid Other | Admitting: Family Medicine

## 2016-11-17 ENCOUNTER — Telehealth: Payer: Self-pay

## 2016-11-17 NOTE — Telephone Encounter (Signed)
Need to speak with a nurse about meds.

## 2016-11-17 NOTE — Telephone Encounter (Signed)
Called pt - stated the last time she was here, she had gained 20 lbs. Wants to know if u could prescribe some type of "weight loss pills"?  Thanks

## 2016-11-21 NOTE — Telephone Encounter (Signed)
Pt calling back

## 2016-11-21 NOTE — Telephone Encounter (Signed)
Called pt - no answer; left message to give us a call back. 

## 2016-11-21 NOTE — Telephone Encounter (Signed)
Had good discussion with patient last visit that I will not be prescribing "weight loss pills" and instead prefer diet and exercise! Thanks!

## 2016-11-21 NOTE — Telephone Encounter (Signed)
Called pt back - informed "Had good discussion with patient last visit that I will not be prescribing "weight loss pills" and instead prefer diet and exercise! " per Dr Vincente LibertyMolt. Stated "ok".

## 2016-12-11 ENCOUNTER — Encounter: Payer: Medicaid Other | Admitting: Family Medicine

## 2016-12-27 ENCOUNTER — Encounter: Payer: Medicaid Other | Admitting: Obstetrics & Gynecology

## 2016-12-29 ENCOUNTER — Ambulatory Visit: Payer: Self-pay

## 2017-01-23 ENCOUNTER — Ambulatory Visit (INDEPENDENT_AMBULATORY_CARE_PROVIDER_SITE_OTHER): Payer: Self-pay | Admitting: Internal Medicine

## 2017-01-23 ENCOUNTER — Encounter: Payer: Self-pay | Admitting: Internal Medicine

## 2017-01-23 ENCOUNTER — Encounter: Payer: Medicaid Other | Admitting: Internal Medicine

## 2017-01-23 VITALS — BP 118/65 | HR 53 | Temp 98.2°F | Ht 59.0 in | Wt 166.3 lb

## 2017-01-23 DIAGNOSIS — J4540 Moderate persistent asthma, uncomplicated: Secondary | ICD-10-CM

## 2017-01-23 DIAGNOSIS — Z6833 Body mass index (BMI) 33.0-33.9, adult: Secondary | ICD-10-CM

## 2017-01-23 DIAGNOSIS — E669 Obesity, unspecified: Secondary | ICD-10-CM | POA: Insufficient documentation

## 2017-01-23 DIAGNOSIS — J45909 Unspecified asthma, uncomplicated: Secondary | ICD-10-CM

## 2017-01-23 MED ORDER — BUDESONIDE-FORMOTEROL FUMARATE 160-4.5 MCG/ACT IN AERO: 2.0000 | INHALATION_SPRAY | Freq: Every evening | RESPIRATORY_TRACT | 2 refills | Status: DC

## 2017-01-23 MED FILL — SYMBICORT 160-4.5 MCG INH: 160-4.5 | 30 days supply | Qty: 10 | Fill #0

## 2017-01-23 NOTE — Progress Notes (Signed)
Internal Medicine Clinic Attending  Case discussed with Dr. Ahmed at the time of the visit.  We reviewed the resident's history and exam and pertinent patient test results.  I agree with the assessment, diagnosis, and plan of care documented in the resident's note. 

## 2017-01-23 NOTE — Assessment & Plan Note (Addendum)
Patient is not having any improvement of her asthma sx after starting on advair + prn albuterol. She wants to go back to  symbicort which she was taking before. Unclear why advair is not working as it is also ICS+LABA just like symbicort. It may be due to the different delivery mechanism.  Will prescribe symbicort again, hopefully she will be able [facility]. Otherwise she will let us know.  Continue prn albuterol. Cont zyrtec+flonase

## 2017-01-23 NOTE — Assessment & Plan Note (Addendum)
She is also worried about her weight going up despite exercising 5 times a week and eating healthier with less calories. Wants medication to help. No hx of thyroid problems in the family. No thyroid symptoms.   Recommended continuing lifestyle changes for now and to give it more time. It will be difficult to start a pharmacologic agent due to lack of insurance. I will also prefer her to discuss this with her PCP.

## 2017-01-23 NOTE — Patient Instructions (Signed)
Let us know if you have problem picking up the symbicort.  Continue exercising and cutting down on calories daily.

## 2017-01-23 NOTE — Progress Notes (Signed)
   CC: asthma f/up  HPI:  [name] is a 21 y.o. with pmh as listed below is here for asthma f/up.   See on 2/18 in our clinic as a new patient. Stated she had asthma as ac hild, was given symbicort for many years with good control of her sx but she was unable to afford this medication recently and has been using PRN albuterol 2-4 times daily. Last visit she was started on advair along with cont PRN albuterol. Was also put on flonase +zyrtec for allergic rhinitis.   Patients tells me today that she was actually never on albuterol before, she was using symbicort 2-4 times a day which helped her breathing. Since last time, she has been using advair and it's not helping, on top she is also using albuterol but she is not getting any improvement of her breathing. She even wakes up at night with chest tightness and SOB. Wants symbicort as it worked great for her.    She is also worried about her weight going up despite exercising 5 times a week and eating healthier with less calories. Wants medication to help. No hx of thyroid problems in the family. No thyroid symptoms.     Past Medical History:  Diagnosis Date  . Asthma   . Migraine   . Seizures (HCC)     Review of Systems:   Review of Systems  Constitutional: Negative for chills and fever.  Respiratory: Positive for shortness of breath and wheezing. Negative for cough and sputum production.   Cardiovascular: Negative for chest pain and leg swelling.  Neurological: Negative for dizziness and headaches.     Physical Exam:  Vitals:   01/23/17 1436  BP: 118/65  Pulse: (!) 53  Temp: 98.2 F (36.8 C)  TempSrc: Oral  SpO2: 100%  Weight: 166 lb 4.8 oz (75.4 kg)  Height: 4\' 11"  (1.499 m)   Physical Exam  Constitutional: She is oriented to person, place, and time. She appears well-developed and well-nourished. No distress.  Cardiovascular: Normal rate and regular rhythm.  Exam reveals no gallop and no friction rub.   No  murmur heard. Respiratory: Effort normal and breath sounds normal. No respiratory distress. She has no wheezes.  GI: Soft. Bowel sounds are normal.  Musculoskeletal: Normal range of motion. She exhibits no edema or tenderness.  Neurological: She is alert and oriented to person, place, and time.  Skin: She is not diaphoretic.    Assessment & Plan:   See Encounters Tab for problem based charting.  Patient discussed with Dr. Oswaldo DoneVincent

## 2017-03-12 NOTE — Progress Notes (Signed)
   CC: follow-up of asthma and obesity  HPI:  Shelby Pittman is a 21 y.o. F with history of asthma and obesity who presents for follow-up of these conditions. She also has complaint of vaginal pain.   Asthma: No PFTs in chart. Dx as child and has been well-controlled on Symbicort, zyrtec and flonase. Did not find improvement with trial of Advair.   Obesity: Patient has expressed frustration over the past several months about gaining weight despite exercising 5x per week. She counts calories and tracks her calories burned with a fit bit. She denies any significant fatigue, hair loss, cold intolerance, swelling. No personal hx of thyroid issues but thinks her dad might have a problem with it.   Depression: Reports she's been feeling down, feeling bad about herself and has a poor appetite for some time. PHQ-9 score 16 and these problems are "very difficult." No suicidal or homicidal ideations.  Vaginal pain: Hx of vaginal pain with any sort of stimulus since she has been sexually active 5 years ago. She has been in a monogamous relationship with 1 person during this time. Denies any hx of sexual abuse. Notes severe pain with penetration and intercourse and feels as though she 'clamps down." as well. Boyfriend is reportedly usually understanding. Denies any dysuria, discharge, abnormal uterine bleeding.  Past Medical History:  Diagnosis Date  . Asthma   . Migraine   . Seizures (HCC)    Review of Systems: Please see HPI for ROS.  Physical Exam: General: Alert, in no acute distress. Pleasant and conversant HEENT: No icterus, injection or ptosis. No hoarseness or dysarthria  Cardiac: RRR, no MGR appreciated Pulmonary: CTA BL with normal WOB on RA. Able to speak in complete sentences Abd: Soft, non-tended. +bs Extremities: Warm, perfused. No significant pedal edema. Psych: Somewhat depressed with congruent affect. Responds to questions appropriately.   Vitals:   03/13/17 1404  BP:  117/65  Pulse: (!) 50  Temp: 98.6 F (37 C)  TempSrc: Oral  SpO2: 100%  Weight: 165 lb 14.4 oz (75.3 kg)  Height: 4\' 11"  (1.499 m)   Body mass index is 33.51 kg/m.  Assessment & Plan:   See Encounters Tab for problem based charting.  Patient discussed with Dr. Criselda PeachesMullen

## 2017-03-13 ENCOUNTER — Encounter: Payer: Self-pay | Admitting: Internal Medicine

## 2017-03-13 ENCOUNTER — Ambulatory Visit (INDEPENDENT_AMBULATORY_CARE_PROVIDER_SITE_OTHER): Payer: Self-pay | Admitting: Internal Medicine

## 2017-03-13 VITALS — BP 117/65 | HR 50 | Temp 98.6°F | Ht 59.0 in | Wt 165.9 lb

## 2017-03-13 DIAGNOSIS — Z79899 Other long term (current) drug therapy: Secondary | ICD-10-CM

## 2017-03-13 DIAGNOSIS — N942 Vaginismus: Secondary | ICD-10-CM

## 2017-03-13 DIAGNOSIS — Z6833 Body mass index (BMI) 33.0-33.9, adult: Secondary | ICD-10-CM

## 2017-03-13 DIAGNOSIS — E669 Obesity, unspecified: Secondary | ICD-10-CM

## 2017-03-13 DIAGNOSIS — J45909 Unspecified asthma, uncomplicated: Secondary | ICD-10-CM

## 2017-03-13 DIAGNOSIS — Z789 Other specified health status: Secondary | ICD-10-CM

## 2017-03-13 DIAGNOSIS — J4540 Moderate persistent asthma, uncomplicated: Secondary | ICD-10-CM

## 2017-03-13 DIAGNOSIS — Z7951 Long term (current) use of inhaled steroids: Secondary | ICD-10-CM

## 2017-03-13 DIAGNOSIS — R102 Pelvic and perineal pain: Secondary | ICD-10-CM

## 2017-03-13 DIAGNOSIS — F320 Major depressive disorder, single episode, mild: Secondary | ICD-10-CM

## 2017-03-13 DIAGNOSIS — F329 Major depressive disorder, single episode, unspecified: Secondary | ICD-10-CM

## 2017-03-13 MED ORDER — ESCITALOPRAM OXALATE 10 MG PO TABS: 10.0000 mg | ORAL_TABLET | Freq: Every day | ORAL | 2 refills | Status: DC

## 2017-03-13 NOTE — Patient Instructions (Signed)
  It was a pleasure seeing you again today!  Today we talked about your pain. For this I am sending you to a gynecologist. It is important that you go to this appointment as soon as your orange card is completed.  Today we also talked about depression. I appreciate your honesty and openness. I am starting you on a medication called Lexapro. Please take this once daily.   Please come back in 1 month so I can see how you are doing on this medication!   Major Depressive Disorder, Adult Major depressive disorder (MDD) is a mental health condition. MDD often makes you feel sad, hopeless, or helpless. MDD can also cause symptoms in your body. MDD can affect your:  Work.  School.  Relationships.  Other normal activities.  MDD can range from mild to very bad. It may occur once (single episode MDD). It can also occur many times (recurrent MDD). The main symptoms of MDD often include:  Feeling sad, depressed, or irritable most of the time.  Loss of interest.  MDD symptoms also include:  Sleeping too much or too little.  Eating too much or too little.  A change in your weight.  Feeling tired (fatigue) or having low energy.  Feeling worthless.  Feeling guilty.  Trouble making decisions.  Trouble thinking clearly.  Thoughts of suicide or harming others.  Feeling weak.  Feeling agitated.  Keeping yourself from being around other people (isolation).  Follow these instructions at home: Activity  Do these things as told by your doctor: ? Go back to your normal activities. ? Exercise regularly. ? Spend time outdoors. Alcohol  Talk with your doctor about how alcohol can affect your antidepressant medicines.  Do not drink alcohol. Or, limit how much alcohol you drink. ? This means no more than 1 drink a day for nonpregnant women and 2 drinks a day for men. One drink equals one of these:  12 oz of beer.  5 oz of wine.  1 oz of hard liquor. General  instructions  Take over-the-counter and prescription medicines only as told by your doctor.  Eat a healthy diet.  Get plenty of sleep.  Find activities that you enjoy. Make time to do them.  Think about joining a support group. Your doctor may be able to suggest a group for you.  Keep all follow-up visits as told by your doctor. This is important. Where to find more information:  [employer] Mental [employer]ness: ? www.nami.org  [employer] of Mental Health: ? http://www.maynard.net/www.nimh.nih.gov  National Suicide Prevention Lifeline: ? 364-315-81551-629-514-2687. This is free, 24-hour help. Contact a doctor if:  Your symptoms get worse.  You have new symptoms. Get help right away if:  You self-harm.  You see, hear, taste, smell, or feel things that are not present (hallucinate). If you ever feel like you may hurt yourself or others, or have thoughts about taking your own life, get help right away. You can go to your nearest emergency department or call:  Your local emergency services (911 in the [place]  A suicide crisis helpline, such as the National Suicide Prevention Lifeline: ? 971-110-84011-629-514-2687. This is open 24 hours a day.  This information is not intended to replace advice given to you by your health care provider. Make sure you discuss any questions you have with your health care provider. Document Released: 07/26/2015 Document Revised: 04/30/2016 Document Reviewed: 04/30/2016 Elsevier Interactive Patient Education  2017 [employer]

## 2017-03-13 NOTE — Assessment & Plan Note (Addendum)
5 yr history of significant vaginal pain with even light stimuli. Slightest touch is painful for the patient. First noticed when she became sexually active 5 years ago. Denies any trauma or sexual abuse. Denies any discharge, foul odor, rashes, vesicles, abdominal pain, abnormal uterine bleeding. Partner supportive but seems to not understand degree of pain. Deferred examination today given patients pain with slight palpation and lack of infectious symptoms and have instead referred to GYN for further evaluation. In addition, she has Nexplanon which is expired and needs to be removed by OB/GYN as well.  -Refer to OB/GYN for nexplanon removal/further evaluation and PAP to avoid multiple painful examinations. Aware patient does not currently have insurance but currently working on [employer].

## 2017-03-13 NOTE — Assessment & Plan Note (Signed)
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 Was taking PRN but has much better success when taken daily. Compliant with zyrtec and flonase.  -Symbicort daily -Albuterol PRN -Continue zyrtec and flonase -would benefit from PFTs at some point however no insurance

## 2017-03-13 NOTE — Assessment & Plan Note (Addendum)
Pt reports continued weight gain despite frequent exercise and dieting. I reviewed her logs and diet and exercise routine could be increased however she certainly is trying. She denies any personal history of thyroid issues and denies any significant fatigue, cold intolerance, hair loss, etc. She does note her dad might have a thyroid issue. At this point, we will continue conservative therapy with treating underlying depression. In the future, could consider adjunct support with wellbutrin vs topamax as should be more affordable for patient vs standard weight loss medications.  -Check TSH at follow-up visit

## 2017-03-14 ENCOUNTER — Telehealth: Payer: Self-pay | Admitting: *Deleted

## 2017-03-14 DIAGNOSIS — F329 Major depressive disorder, single episode, unspecified: Secondary | ICD-10-CM | POA: Insufficient documentation

## 2017-03-14 DIAGNOSIS — F32A Depression, unspecified: Secondary | ICD-10-CM | POA: Insufficient documentation

## 2017-03-14 MED FILL — ESCITALOPRAM 10 MG TABLET: 10 | 30 days supply | Qty: 30 | Fill #0

## 2017-03-14 NOTE — Assessment & Plan Note (Signed)
Patient has nexplanon implant that is 3.5+ yrs old. Aware this needs to be removed by GYN. She was counseled on importance of back-up methods of birth control. -Refer to GYN for removal

## 2017-03-14 NOTE — Telephone Encounter (Signed)
Pt called upset stating her med was not at pharm, lexapro, sent to [facility], she states she does not use [facility] anymore she uses [facility], called and cancelled at [facility], called to [facility]. [facility] removed from her profile

## 2017-03-14 NOTE — Assessment & Plan Note (Signed)
She denies any SI/HI. She has been feeling down, depressed, fatigued, decr appetite and loss of interest in activities for some time. She is open to trial of SSRI. -PHQ-9 16 today -Start Lexapro 10 mg today  -FU 1-2 months to evaluate response. Please obtain follow up PHQ-9 -If poor response, consider another SSRI vs wellbutrin (as patient desires weight loss as well)

## 2017-03-16 ENCOUNTER — Telehealth: Payer: Self-pay | Admitting: Internal Medicine

## 2017-03-16 NOTE — Telephone Encounter (Signed)
TALKED TO HER AND GAVE HER A LIST OF PAPER WORK NEEDED TO FINISH APP FOR [employer] CARD, WILL BRING IN NEXT WEEK

## 2017-03-17 NOTE — Progress Notes (Signed)
Internal Medicine Clinic Attending  Case discussed with Dr. Molt at the time of the visit.  We reviewed the resident's history and exam and pertinent patient test results.  I agree with the assessment, diagnosis, and plan of care documented in the resident's note. 

## 2017-04-02 MED FILL — MONTELUKAST SOD 10 MG TAB: 10 | 30 days supply | Qty: 30 | Fill #1

## 2017-04-05 ENCOUNTER — Emergency Department (HOSPITAL_COMMUNITY)
Admission: EM | Admit: 2017-04-05 | Discharge: 2017-04-05 | Disposition: A | Discharge status: Home / Self Care | Payer: Self-pay | Attending: Emergency Medicine | Admitting: Emergency Medicine

## 2017-04-05 ENCOUNTER — Encounter (HOSPITAL_COMMUNITY): Payer: Self-pay | Admitting: Emergency Medicine

## 2017-04-05 DIAGNOSIS — K529 Noninfective gastroenteritis and colitis, unspecified: Secondary | ICD-10-CM

## 2017-04-05 DIAGNOSIS — Z79899 Other long term (current) drug therapy: Secondary | ICD-10-CM | POA: Insufficient documentation

## 2017-04-05 DIAGNOSIS — J45909 Unspecified asthma, uncomplicated: Secondary | ICD-10-CM | POA: Insufficient documentation

## 2017-04-05 LAB — CBC
HCT: 42.7 % (ref 36.0–46.0)
Hemoglobin: 14.7 g/dL (ref 12.0–15.0)
MCH: 30.5 pg (ref 26.0–34.0)
MCHC: 34.4 g/dL (ref 30.0–36.0)
MCV: 88.6 fL (ref 78.0–100.0)
PLATELETS: 256 10*3/uL (ref 150–400)
RBC: 4.82 MIL/uL (ref 3.87–5.11)
RDW: 12.9 % (ref 11.5–15.5)
WBC: 7.2 10*3/uL (ref 4.0–10.5)

## 2017-04-05 LAB — URINALYSIS, ROUTINE W REFLEX MICROSCOPIC
Bacteria, UA: NONE SEEN
Glucose, UA: NEGATIVE mg/dL
Hgb urine dipstick: NEGATIVE
KETONES UR: 20 mg/dL — AB
Leukocytes, UA: NEGATIVE
Nitrite: NEGATIVE
PH: 7 (ref 5.0–8.0)
PROTEIN: 30 mg/dL — AB
Specific Gravity, Urine: 1.031 — ABNORMAL HIGH (ref 1.005–1.030)

## 2017-04-05 LAB — COMPREHENSIVE METABOLIC PANEL
ALBUMIN: 4.4 g/dL (ref 3.5–5.0)
ALK PHOS: 34 U/L — AB (ref 38–126)
ALT: 15 U/L (ref 14–54)
AST: 20 U/L (ref 15–41)
Anion gap: 7 (ref 5–15)
BUN: 14 mg/dL (ref 6–20)
CALCIUM: 9.4 mg/dL (ref 8.9–10.3)
CHLORIDE: 106 mmol/L (ref 101–111)
CO2: 26 mmol/L (ref 22–32)
CREATININE: 0.84 mg/dL (ref 0.44–1.00)
GFR calc non Af Amer: >60 mL/min (ref >60)
GLUCOSE: 108 mg/dL — AB (ref 65–99)
Potassium: 3.5 mmol/L (ref 3.5–5.1)
SODIUM: 139 mmol/L (ref 135–145)
Total Bilirubin: 0.6 mg/dL (ref 0.3–1.2)
Total Protein: 8 g/dL (ref 6.5–8.1)

## 2017-04-05 LAB — LIPASE, BLOOD: Lipase: 23 U/L (ref 11–51)

## 2017-04-05 LAB — I-STAT BETA HCG BLOOD, ED (MC, WL, AP ONLY): I-stat hCG, quantitative: <5 m[IU]/mL (ref <5)

## 2017-04-05 MED ORDER — SODIUM CHLORIDE 0.9 % IV BOLUS (SEPSIS)
1000.0000 mL | Freq: Once | INTRAVENOUS | Status: AC
  Administered 2017-04-05: 1000 mL via INTRAVENOUS

## 2017-04-05 MED ORDER — ONDANSETRON HCL 4 MG/2ML IJ SOLN
4.0000 mg | Freq: Once | INTRAMUSCULAR | Status: AC
  Administered 2017-04-05: 4 mg via INTRAVENOUS
  Filled 2017-04-05: qty 2

## 2017-04-05 MED ORDER — ONDANSETRON 4 MG PO TBDP: 4.0000 mg | ORAL_TABLET | Freq: Three times a day (TID) | ORAL | 0 refills | Status: DC | PRN

## 2017-04-05 MED ORDER — OXYMETAZOLINE HCL 0.05 % NA SOLN: 1.0000 | Freq: Two times a day (BID) | NASAL | 0 refills | Status: DC

## 2017-04-05 MED FILL — ONDANSETRON ODT 4 MG TABLET: 4 | 3 days supply | Qty: 10 | Fill #0

## 2017-04-05 NOTE — Discharge Instructions (Signed)
Take zofran as needed for nausea. Stay hydrated. Follow up with your primary care provider if your symptoms do not improve. Return to the ED if you experience severe worsening of your symptoms, fever, abdominal pain, blood in stool or vomit.

## 2017-04-05 NOTE — ED Provider Notes (Signed)
[facility]-EMERGENCY DEPT Provider Note   CSN: [id number] Arrival date & time: 04/05/17  1108     History   Chief Complaint Chief Complaint  Patient presents with  . Abdominal Pain    HPI Shelby Pittman is a 21 y.o. female with a pmhx of asthma, allergies who presented to the ED today complaining of N/V/D. Patient states that last night she was in her otherwise normal state of health when she woke up around 3 AM and felt very nauseated she subsequently had multiple episodes of nonbloody and nonbilious emesis. This was followed by several percent of diarrhea. She denies any melena, hematochezia. She has had some abdominal cramping that precedes the diarrhea and reports that she overall feels very weak. She denies any fevers, chills. She does state that her aunt that she recently visited with had similar symptoms. She denies any new medications recently. Denies dysuria, vaginal discharge, hematuria.  HPI  Past Medical History:  Diagnosis Date  . Asthma   . Migraine   . Seizures [facility])     Patient Active Problem List   Diagnosis Date Noted  . Depression 03/14/2017  . Vaginal pain 03/13/2017  . Obesity (BMI 30-39.9) 01/23/2017  . Asthma 09/28/2016  . Allergic rhinitis 09/28/2016  . Tobacco abuse counseling 09/28/2016  . Uses contraceptive implants as primary birth control method 09/28/2016    Past Surgical History:  Procedure Laterality Date  . FRACTURE SURGERY      OB History    No data available       Home Medications    Prior to Admission medications   Medication Sig Start Date End Date Taking? Authorizing Provider  albuterol (PROVENTIL HFA;VENTOLIN HFA) 108 (90 Base) MCG/ACT inhaler Inhale 2 puffs into the lungs every 6 (six) hours as needed for wheezing. 09/28/16  Yes Molt, Bethany, DO  Bepotastine Besilate (BEPREVE) 1.5 % SOLN Place 1 drop into both eyes daily.   Yes [provider]  budesonide-formoterol (SYMBICORT) 160-4.5 MCG/ACT inhaler Inhale 2 puffs  into the lungs every evening. 01/23/17  Yes Ahmed, Elyn Peersasrif, MD  cetirizine (ZYRTEC) 10 MG tablet Take 1 tablet (10 mg total) by mouth daily. 09/28/16  Yes Molt, Bethany, DO  escitalopram (LEXAPRO) 10 MG tablet Take 1 tablet (10 mg total) by mouth daily. 03/13/17 03/13/18 Yes Molt, Bethany, DO  etonogestrel (IMPLANON) 68 MG IMPL implant Inject 1 each into the skin once. Received implant 01/2013   Yes [provider]  fluticasone (FLONASE) 50 MCG/ACT nasal spray Place 2 sprays into both nostrils daily. 09/28/16 09/28/17 Yes Molt, Bethany, DO  montelukast (SINGULAIR) 10 MG tablet Take 1 tablet (10 mg total) by mouth daily. 10/11/16 10/11/17 Yes Molt, Bethany, DO    Family History Family History  Problem Relation Age of Onset  . Hypertension Mother   . Hypertension Other   . Diabetes Other     Social History Social History  Substance Use Topics  . Smoking status: Never Smoker  . Smokeless tobacco: Never Used  . Alcohol use No     Allergies   Patient has no known allergies.   Review of Systems Review of Systems  All other systems reviewed and are negative.    Physical Exam Updated Vital Signs BP (!) 127/96 (BP Location: Right Arm)   Pulse 77   Temp 98 F (36.7 C) (Oral)   Resp 18   Ht 4\' 11"  (1.499 m)   Wt 72.4 kg (159 lb 9.6 oz)   SpO2 100%   BMI  32.24 kg/m   Physical Exam  Constitutional: She is oriented to person, place, and time. She appears well-developed and well-nourished. No distress.  HENT:  Head: Normocephalic and atraumatic.  Mouth/Throat: No oropharyngeal exudate.  Eyes: Pupils are equal, round, and reactive to light. Conjunctivae and EOM are normal. Right eye exhibits no discharge. Left eye exhibits no discharge. No scleral icterus.  Cardiovascular: Normal rate, regular rhythm, normal heart sounds and intact distal pulses.  Exam reveals no gallop and no friction rub.   No murmur heard. Pulmonary/Chest: Effort normal and breath sounds normal. No respiratory  distress. She has no wheezes. She has no rales. She exhibits no tenderness.  Abdominal: Soft. She exhibits no distension. There is no tenderness. There is no guarding.  Musculoskeletal: Normal range of motion. She exhibits no edema.  Neurological: She is alert and oriented to person, place, and time.  Skin: Skin is warm and dry. No rash noted. She is not diaphoretic. No erythema. No pallor.  Psychiatric: She has a normal mood and affect. Her behavior is normal.  Nursing note and vitals reviewed.    ED Treatments / Results  Labs (all labs ordered are listed, but only abnormal results are displayed) Labs Reviewed  COMPREHENSIVE METABOLIC PANEL - Abnormal; Notable for the following:       Result Value   Glucose, Bld 108 (*)    Alkaline Phosphatase 34 (*)    All other components within normal limits  URINALYSIS, ROUTINE W REFLEX MICROSCOPIC - Abnormal; Notable for the following:    Specific Gravity, Urine 1.031 (*)    Bilirubin Urine SMALL (*)    Ketones, ur 20 (*)    Protein, ur 30 (*)    Squamous Epithelial / LPF 0-5 (*)    All other components within normal limits  LIPASE, BLOOD  CBC  I-STAT BETA HCG BLOOD, ED ([facility], [facility], [facility] ONLY)    EKG  EKG Interpretation None       Radiology No results found.  Procedures Procedures (including critical care time)  Medications Ordered in ED Medications  sodium chloride 0.9 % bolus 1,000 mL (1,000 mLs Intravenous New Bag/Given 04/05/17 1259)  ondansetron (ZOFRAN) injection 4 mg (4 mg Intravenous Given 04/05/17 1259)     Initial Impression / Assessment and Plan / ED Course  I have reviewed the triage vital signs and the nursing notes.  Pertinent labs & imaging results that were available during my care of the patient were reviewed by me and considered in my medical decision making (see chart for details).    Otherwise healthy 21 year old female presents to the ED with acute onset nausea, vomiting and diarrhea that started around 3 AM  this morning. She has recent sick contacts with her aunt with similar symptoms. She is afebrile and nontoxic and nonseptic appearing on exam. Will she denies any abdominal pain. She has no metabolic derrangement on her lab work. UA negative. Pregnancy test is negative. I suspect her symptoms are related to viral gastroenteritis. She was given IV fluids as well as Zofran. Upon my reassessment she received these medications she is feeling much better she is able to tolerate by mouth at this time. She is safe for discharge with PCP follow-up. Patient also states that she has chronic nasal congestion due to allergies and is requesting the spray for this. Will provide prescription for Afrin she has fluticasone spray at home that isn't working for her. Return precautions outlined to patient discharge instructions.  Final Clinical Impressions(s) / ED  Diagnoses   Final diagnoses:  None    New Prescriptions New Prescriptions   No medications on file     Dub Mikes, PA-C 04/05/17 1600    Lavera Guise, MD 04/05/17 [phone]

## 2017-04-05 NOTE — ED Triage Notes (Signed)
Pt states that she has had N/V and abdominal pain since this morning at 3am. States she has implanon in her L arm but it has been expired for some time and she wants to have a pregnancy test done. Alert and oriented.

## 2017-04-05 NOTE — ED Notes (Signed)
PA at bedside,  

## 2017-04-12 ENCOUNTER — Encounter (HOSPITAL_COMMUNITY): Payer: Self-pay | Admitting: Emergency Medicine

## 2017-04-12 ENCOUNTER — Emergency Department (HOSPITAL_COMMUNITY)
Admission: EM | Admit: 2017-04-12 | Discharge: 2017-04-12 | Disposition: A | Discharge status: Home / Self Care | Payer: Self-pay | Attending: Emergency Medicine | Admitting: Emergency Medicine

## 2017-04-12 ENCOUNTER — Emergency Department (HOSPITAL_COMMUNITY): Payer: Self-pay

## 2017-04-12 DIAGNOSIS — R51 Headache: Secondary | ICD-10-CM | POA: Insufficient documentation

## 2017-04-12 DIAGNOSIS — H538 Other visual disturbances: Secondary | ICD-10-CM | POA: Insufficient documentation

## 2017-04-12 DIAGNOSIS — J45909 Unspecified asthma, uncomplicated: Secondary | ICD-10-CM | POA: Insufficient documentation

## 2017-04-12 DIAGNOSIS — Z79899 Other long term (current) drug therapy: Secondary | ICD-10-CM | POA: Insufficient documentation

## 2017-04-12 DIAGNOSIS — R519 Headache, unspecified: Secondary | ICD-10-CM

## 2017-04-12 MED ORDER — KETOROLAC TROMETHAMINE 30 MG/ML IJ SOLN
30.0000 mg | Freq: Once | INTRAMUSCULAR | Status: AC
  Administered 2017-04-12: 30 mg via INTRAVENOUS
  Filled 2017-04-12: qty 1

## 2017-04-12 MED ORDER — METOCLOPRAMIDE HCL 5 MG/ML IJ SOLN
10.0000 mg | Freq: Once | INTRAMUSCULAR | Status: AC
  Administered 2017-04-12: 10 mg via INTRAVENOUS
  Filled 2017-04-12: qty 2

## 2017-04-12 MED ORDER — DIPHENHYDRAMINE HCL 50 MG/ML IJ SOLN
25.0000 mg | Freq: Once | INTRAMUSCULAR | Status: AC
  Administered 2017-04-12: 25 mg via INTRAVENOUS
  Filled 2017-04-12: qty 1

## 2017-04-12 MED ORDER — SODIUM CHLORIDE 0.9 % IV BOLUS (SEPSIS)
1000.0000 mL | Freq: Once | INTRAVENOUS | Status: AC
  Administered 2017-04-12: 1000 mL via INTRAVENOUS

## 2017-04-12 MED ORDER — BUTALBITAL-APAP-CAFFEINE 50-325-40 MG PO TABS: 1.0000 | ORAL_TABLET | Freq: Four times a day (QID) | ORAL | 0 refills | Status: DC | PRN

## 2017-04-12 NOTE — Discharge Instructions (Signed)
Please read and follow all provided instructions.  Your diagnoses today include:  1. Acute nonintractable headache, unspecified headache type     Tests performed today include: CT of your head which was normal and did not show any serious cause of your headache Vital signs. See below for your results today.   Medications:  In the Emergency Department you received: Reglan - antinausea/headache medication Benadryl - antihistamine to counteract potential side effects of reglan Toradol - NSAID medication similar to ibuprofen  Take any prescribed medications only as directed.  Additional information:  Follow any educational materials contained in this packet.  You are having a headache. No specific cause was found today for your headache. It may have been a migraine or other cause of headache. Stress, anxiety, fatigue, and depression are common triggers for headaches.   Your headache today does not appear to be life-threatening or require hospitalization, but often the exact cause of headaches is not determined in the emergency department. Therefore, follow-up with your doctor is very important to find out what may have caused your headache and whether or not you need any further diagnostic testing or treatment.   Sometimes headaches can appear benign (not harmful), but then more serious symptoms can develop which should prompt an immediate re-evaluation by your doctor or the emergency department.  BE VERY CAREFUL not to take multiple medicines containing Tylenol (also called acetaminophen). Doing so can lead to an overdose which can damage your liver and cause liver failure and possibly death.   Follow-up instructions: Please follow-up with your primary care provider in the next 3 days for further evaluation of your symptoms.   Return instructions:  Please return to the Emergency Department if you experience worsening symptoms. Return if the medications do not resolve your headache, if  it recurs, or if you have multiple episodes of vomiting or cannot keep down fluids. Return if you have a change from the usual headache. RETURN IMMEDIATELY IF you: Develop a sudden, severe headache Develop confusion or become poorly responsive or faint Develop a fever above 100.44F or problem breathing Have a change in speech, vision, swallowing, or understanding Develop new weakness, numbness, tingling, incoordination in your arms or legs Have a seizure Please return if you have any other emergent concerns.  Additional Information:  Your vital signs today were: BP 123/71 (BP Location: Right Arm)    Pulse 70    Temp 98.8 F (37.1 C) (Oral)    Resp 20    SpO2 100%  If your blood pressure (BP) was elevated above 135/85 this visit, please have this repeated by your doctor within one month. --------------

## 2017-04-12 NOTE — ED Triage Notes (Signed)
Patient here from work with complaints of headache x1 week with blurred vision. OTC medications with no relief. Nausea/vomiting. Hx of same, use to see neuro when younger but stopped when headaches got better.

## 2017-04-12 NOTE — ED Provider Notes (Signed)
[facility]-EMERGENCY DEPT Provider Note   CSN: [id number] Arrival date & time: 04/12/17  1604     History   Chief Complaint Chief Complaint  Patient presents with  . Headache  . Blurred Vision    HPI Shelby Pittman is a 21 y.o. female.  HPI  21 y.o. female with a hx of Migraine, presents to the Emergency Department today due to headache x 1 week. Associated blurred vision. Hx same. Noted N/V. States that headache initially began as sharp sudden sensation. Noted pain 10/10. Throbbing sensation to bilateral temples. Notes taking motrin with minimal relief. Notes waxing and waning pain. No CP/SOB/ABD pain. No numbness/tingling. No fevers. No neck stiffness. No other symptoms noted.    Past Medical History:  Diagnosis Date  . Asthma   . Migraine   . Seizures [facility](HCC)     Patient Active Problem List   Diagnosis Date Noted  . Depression 03/14/2017  . Vaginal pain 03/13/2017  . Obesity (BMI 30-39.9) 01/23/2017  . Asthma 09/28/2016  . Allergic rhinitis 09/28/2016  . Tobacco abuse counseling 09/28/2016  . Uses contraceptive implants as primary birth control method 09/28/2016    Past Surgical History:  Procedure Laterality Date  . FRACTURE SURGERY      OB History    No data available       Home Medications    Prior to Admission medications   Medication Sig Start Date End Date Taking? Authorizing Provider  albuterol (PROVENTIL HFA;VENTOLIN HFA) 108 (90 Base) MCG/ACT inhaler Inhale 2 puffs into the lungs every 6 (six) hours as needed for wheezing. 09/28/16   Molt, Bethany, DO  Bepotastine Besilate (BEPREVE) 1.5 % SOLN Place 1 drop into both eyes daily.    [provider]  budesonide-formoterol (SYMBICORT) 160-4.5 MCG/ACT inhaler Inhale 2 puffs into the lungs every evening. 01/23/17   Hyacinth MeekerAhmed, Tasrif, MD  cetirizine (ZYRTEC) 10 MG tablet Take 1 tablet (10 mg total) by mouth daily. 09/28/16   Molt, Bethany, DO  escitalopram (LEXAPRO) 10 MG tablet Take 1 tablet (10 mg total)  by mouth daily. 03/13/17 03/13/18  Molt, Bethany, DO  etonogestrel (IMPLANON) 68 MG IMPL implant Inject 1 each into the skin once. Received implant 01/2013    [provider]  fluticasone (FLONASE) 50 MCG/ACT nasal spray Place 2 sprays into both nostrils daily. 09/28/16 09/28/17  Molt, Bethany, DO  montelukast (SINGULAIR) 10 MG tablet Take 1 tablet (10 mg total) by mouth daily. 10/11/16 10/11/17  Molt, Bethany, DO  ondansetron (ZOFRAN ODT) 4 MG disintegrating tablet Take 1 tablet (4 mg total) by mouth every 8 (eight) hours as needed for nausea or vomiting. 04/05/17   Dowless, Lelon MastSamantha Tripp, PA-C  oxymetazoline (AFRIN NASAL SPRAY) 0.05 % nasal spray Place 1 spray into both nostrils 2 (two) times daily. 04/05/17   Dowless, Lester KinsmanSamantha Tripp, PA-C    Family History Family History  Problem Relation Age of Onset  . Hypertension Mother   . Hypertension Other   . Diabetes Other     Social History Social History  Substance Use Topics  . Smoking status: Never Smoker  . Smokeless tobacco: Never Used  . Alcohol use No     Allergies   Patient has no known allergies.   Review of Systems Review of Systems ROS reviewed and all are negative for acute change except as noted in the HPI.  Physical Exam Updated Vital Signs BP 123/71 (BP Location: Right Arm)   Pulse 70   Temp 98.8 F (37.1 C) (Oral)  Resp 20   SpO2 100%   Physical Exam  Constitutional: She is oriented to person, place, and time. Vital signs are normal. She appears well-developed and well-nourished.  HENT:  Head: Normocephalic and atraumatic.  Right Ear: Hearing normal.  Left Ear: Hearing normal.  Eyes: Pupils are equal, round, and reactive to light. Conjunctivae and EOM are normal.  Neck: Normal range of motion. Neck supple.  Cardiovascular: Normal rate, regular rhythm, normal heart sounds and intact distal pulses.   Pulmonary/Chest: Effort normal and breath sounds normal.  Musculoskeletal: Normal range of motion.    Neurological: She is alert and oriented to person, place, and time. She has normal strength. No cranial nerve deficit or sensory deficit.  Cranial Nerves:  II: Pupils equal, round, reactive to light III,IV, VI: ptosis not present, extra-ocular motions intact bilaterally  V,VII: smile symmetric, facial light touch sensation equal VIII: hearing grossly normal bilaterally  IX,X: midline uvula rise  XI: bilateral shoulder shrug equal and strong XII: midline tongue extension  Skin: Skin is warm and dry.  Psychiatric: She has a normal mood and affect. Her speech is normal and behavior is normal. Thought content normal.  Nursing note and vitals reviewed.    ED Treatments / Results  Labs (all labs ordered are listed, but only abnormal results are displayed) Labs Reviewed - No data to display  EKG  EKG Interpretation None       Radiology Ct Head Wo Contrast  Result Date: 04/12/2017 CLINICAL DATA:  Headache for 1 week.  Blurred vision. EXAM: CT HEAD WITHOUT CONTRAST TECHNIQUE: Contiguous axial images were obtained from the base of the skull through the vertex without intravenous contrast. COMPARISON:  None. FINDINGS: Brain: No evidence of acute infarction, hemorrhage, hydrocephalus, extra-axial collection or mass lesion/mass effect. Vascular: No hyperdense vessel or unexpected calcification. Skull: Normal. Negative for fracture or focal lesion. Sinuses/Orbits: No acute finding. Other: None. IMPRESSION: Normal brain.  No acute abnormalities. Electronically Signed   By: Gerome Sam III M.D   On: 04/12/2017 19:50    Procedures Procedures (including critical care time)  Medications Ordered in ED Medications  metoCLOPramide (REGLAN) injection 10 mg (not administered)  diphenhydrAMINE (BENADRYL) injection 25 mg (not administered)  sodium chloride 0.9 % bolus 1,000 mL (not administered)  ketorolac (TORADOL) 30 MG/ML injection 30 mg (not administered)     Initial Impression /  Assessment and Plan / ED Course  I have reviewed the triage vital signs and the nursing notes.  Pertinent labs & imaging results that were available during my care of the patient were reviewed by me and considered in my medical decision making (see chart for details).  Final Clinical Impressions(s) / ED Diagnoses  {I have reviewed and evaluated the relevant laboratory values. {I have reviewed and evaluated the relevant imaging studies.  {I have reviewed the relevant previous healthcare records.  {I obtained HPI from historian.   ED Course:  Assessment: Patient is a 21 y.o. female with a hx of Migraine, presents to the Emergency Department today due to headache x 1 week. Associated blurred vision. Hx same. Noted N/V. States that headache initially began as sharp sudden sensation. Noted pain 10/10. Throbbing sensation to bilateral temples. Notes taking motrin with minimal relief. Notes waxing and waning pain. No CP/SOB/ABD pain. No numbness/tingling. No fevers. No neck stiffness. On exam, pt in NAD. Nontoxic/nonseptic appearing. VSS. Afebrile. Lungs CTA. Heart RRR. CN evaluated and unremarkable. Due to sudden onset. CT obtained, which was unremarkable. Given Reglan, Benadryl, Fluids  with releif. Patient is without high-risk features of headache including: Altered mental status, Accompanying seizure, Headache with exertion, Age > 50, History of immunocompromise, Neck or shoulder pain, Fever, Use of anticoagulation, Family history of spontaneous SAH, Concomitant drug use, Toxic exposure.  Patient has a normal complete neurological exam, normal vital signs, normal level of consciousness, no signs of meningismus, is well-appearing/non-toxic appearing, no signs of trauma. No papilledema, no pain over the temporal arteries. No dangerous or life-threatening conditions suspected or identified by history, physical exam, and by work-up. No indications for hospitalization identified. At time of discharge, Patient is  in no acute distress. Vital Signs are stable. Patient is able to ambulate. Patient able to tolerate PO.   Disposition/Plan:  DC Home Additional Verbal discharge instructions given and discussed with patient.  Pt Instructed to f/u with PCP in the next week for evaluation and treatment of symptoms. Return precautions given Pt acknowledges and agrees with plan  Supervising Physician Mabe, Latanya Maudlin, MD  Final diagnoses:  Acute nonintractable headache, unspecified headache type    New Prescriptions New Prescriptions   No medications on file     Audry Pili, Cordelia Poche 04/12/17 2131    Phillis Haggis, MD 04/12/17 2255

## 2017-04-13 MED FILL — BUTALB-ACETAMIN-CAFF 50-325: 50-325-40 | 3 days supply | Qty: 10 | Fill #0

## 2017-04-15 ENCOUNTER — Encounter (HOSPITAL_COMMUNITY): Payer: Self-pay | Admitting: Emergency Medicine

## 2017-04-15 ENCOUNTER — Emergency Department (HOSPITAL_COMMUNITY)
Admission: EM | Admit: 2017-04-15 | Discharge: 2017-04-15 | Disposition: A | Discharge status: Home / Self Care | Payer: Self-pay | Attending: Emergency Medicine | Admitting: Emergency Medicine

## 2017-04-15 DIAGNOSIS — Z79899 Other long term (current) drug therapy: Secondary | ICD-10-CM | POA: Insufficient documentation

## 2017-04-15 DIAGNOSIS — J45909 Unspecified asthma, uncomplicated: Secondary | ICD-10-CM | POA: Insufficient documentation

## 2017-04-15 DIAGNOSIS — G43909 Migraine, unspecified, not intractable, without status migrainosus: Secondary | ICD-10-CM | POA: Insufficient documentation

## 2017-04-15 MED ORDER — KETOROLAC TROMETHAMINE 15 MG/ML IJ SOLN
15.0000 mg | Freq: Once | INTRAMUSCULAR | Status: AC
  Administered 2017-04-15: 15 mg via INTRAVENOUS
  Filled 2017-04-15: qty 1

## 2017-04-15 MED ORDER — BUTALBITAL-APAP-CAFFEINE 50-325-40 MG PO TABS: 1.0000 | ORAL_TABLET | Freq: Four times a day (QID) | ORAL | 0 refills | Status: DC | PRN

## 2017-04-15 MED ORDER — DIPHENHYDRAMINE HCL 50 MG/ML IJ SOLN
25.0000 mg | Freq: Once | INTRAMUSCULAR | Status: AC
  Administered 2017-04-15: 25 mg via INTRAVENOUS
  Filled 2017-04-15: qty 1

## 2017-04-15 MED ORDER — SODIUM CHLORIDE 0.9 % IV BOLUS (SEPSIS)
1000.0000 mL | Freq: Once | INTRAVENOUS | Status: AC
  Administered 2017-04-15: 1000 mL via INTRAVENOUS

## 2017-04-15 MED ORDER — ONDANSETRON 4 MG PO TBDP
4.0000 mg | ORAL_TABLET | Freq: Once | ORAL | Status: AC | PRN
  Administered 2017-04-15: 4 mg via ORAL
  Filled 2017-04-15: qty 1

## 2017-04-15 MED ORDER — METOCLOPRAMIDE HCL 5 MG/ML IJ SOLN
10.0000 mg | Freq: Once | INTRAMUSCULAR | Status: AC
  Administered 2017-04-15: 10 mg via INTRAVENOUS
  Filled 2017-04-15: qty 2

## 2017-04-15 NOTE — ED Triage Notes (Signed)
Pt comes in with complaints of recurrent migraines.  Pt states they gave her a caffeine pill to take and it was helping but it has not helped today.  Also reports a possible sinus infection. Endorses nausea without vomiting.  Also reports constipation.  Last BM was small and hard yesterday morning.  Pt feels weak.

## 2017-04-15 NOTE — ED Provider Notes (Signed)
[facility]-EMERGENCY DEPT Provider Note   CSN: [id number] Arrival date & time: 04/15/17  1734     History   Chief Complaint Chief Complaint  Patient presents with  . Nasal Congestion  . Migraine  . Constipation    HPI Shelby Pittman is a 21 y.o. female.  HPI Patient presents to the emergency room for evaluation of recurrent migraine headaches. Patient was seen in the emergency room a few days ago. At that time she was having a migraine headache. Patient states she was treated with gr cocktail and her symptoms improved. The last day or so the symptoms have returned. She tried taking Fioricet at that did not help. She thinks she might have a sinus infection because she feels sinus pressure but denies any fever or nasal discharge. She's had some nausea without vomiting. No abdominal pain. No chills. No neck pain or stiffness. Past Medical History:  Diagnosis Date  . Asthma   . Migraine   . Seizures [facility])     Patient Active Problem List   Diagnosis Date Noted  . Depression 03/14/2017  . Vaginal pain 03/13/2017  . Obesity (BMI 30-39.9) 01/23/2017  . Asthma 09/28/2016  . Allergic rhinitis 09/28/2016  . Tobacco abuse counseling 09/28/2016  . Uses contraceptive implants as primary birth control method 09/28/2016    Past Surgical History:  Procedure Laterality Date  . FRACTURE SURGERY      OB History    No data available       Home Medications    Prior to Admission medications   Medication Sig Start Date End Date Taking? Authorizing Provider  albuterol (PROVENTIL HFA;VENTOLIN HFA) 108 (90 Base) MCG/ACT inhaler Inhale 2 puffs into the lungs every 6 (six) hours as needed for wheezing. 09/28/16   Molt, Bethany, DO  budesonide-formoterol (SYMBICORT) 160-4.5 MCG/ACT inhaler Inhale 2 puffs into the lungs every evening. 01/23/17   Hyacinth Meeker, MD  butalbital-acetaminophen-caffeine (FIORICET, ESGIC) [phone] MG tablet Take 1 tablet by mouth every 6 (six) hours as needed for  headache. 04/15/17   Linwood Dibbles, MD  cetirizine (ZYRTEC) 10 MG tablet Take 1 tablet (10 mg total) by mouth daily. 09/28/16   Molt, Bethany, DO  escitalopram (LEXAPRO) 10 MG tablet Take 1 tablet (10 mg total) by mouth daily. Patient not taking: Reported on 04/12/2017 03/13/17 03/13/18  Molt, Bethany, DO  etonogestrel (IMPLANON) 68 MG IMPL implant Inject 1 each into the skin once. Received implant 01/2013    [provider]  fluticasone (FLONASE) 50 MCG/ACT nasal spray Place 2 sprays into both nostrils daily. 09/28/16 09/28/17  Molt, Bethany, DO  montelukast (SINGULAIR) 10 MG tablet Take 1 tablet (10 mg total) by mouth daily. 10/11/16 10/11/17  Molt, Bethany, DO  ondansetron (ZOFRAN ODT) 4 MG disintegrating tablet Take 1 tablet (4 mg total) by mouth every 8 (eight) hours as needed for nausea or vomiting. Patient not taking: Reported on 04/12/2017 04/05/17   Dowless, Lelon Mast Tripp, PA-C  oxymetazoline (AFRIN NASAL SPRAY) 0.05 % nasal spray Place 1 spray into both nostrils 2 (two) times daily. Patient not taking: Reported on 04/12/2017 04/05/17   Dowless, Lester Kinsman, PA-C    Family History Family History  Problem Relation Age of Onset  . Hypertension Mother   . Hypertension Other   . Diabetes Other     Social History Social History  Substance Use Topics  . Smoking status: Never Smoker  . Smokeless tobacco: Never Used  . Alcohol use No     Allergies  Patient has no known allergies.   Review of Systems Review of Systems  All other systems reviewed and are negative.    Physical Exam Updated Vital Signs BP 131/82   Pulse 63   Temp 98.2 F (36.8 C) (Oral)   Resp 18   Ht 1.499 m (4\' 11" )   Wt 72.6 kg (160 lb)   SpO2 100%   BMI 32.32 kg/m   Physical Exam  Constitutional: She appears well-developed and well-nourished. No distress.  HENT:  Head: Normocephalic and atraumatic.  Right Ear: External ear normal.  Left Ear: External ear normal.  Eyes: Conjunctivae are normal.  Right eye exhibits no discharge. Left eye exhibits no discharge. No scleral icterus.  Neck: Normal range of motion. Neck supple. No tracheal deviation present.  Cardiovascular: Normal rate, regular rhythm and intact distal pulses.   Pulmonary/Chest: Effort normal and breath sounds normal. No stridor. No respiratory distress. She has no wheezes. She has no rales.  Abdominal: Soft. Bowel sounds are normal. She exhibits no distension. There is no tenderness. There is no rebound and no guarding.  Musculoskeletal: She exhibits no edema or tenderness.  Neurological: She is alert. She has normal strength. No cranial nerve deficit (no facial droop, extraocular movements intact, no slurred speech) or sensory deficit. She exhibits normal muscle tone. She displays no seizure activity. Coordination normal.  Skin: Skin is warm and dry. No rash noted.  Psychiatric: She has a normal mood and affect.  Nursing note and vitals reviewed.    ED Treatments / Results   Procedures Procedures (including critical care time)  Medications Ordered in ED Medications  ondansetron (ZOFRAN-ODT) disintegrating tablet 4 mg (4 mg Oral Given 04/15/17 1752)  diphenhydrAMINE (BENADRYL) injection 25 mg (25 mg Intravenous Given 04/15/17 2000)  ketorolac (TORADOL) 15 MG/ML injection 15 mg (15 mg Intravenous Given 04/15/17 2003)  sodium chloride 0.9 % bolus 1,000 mL (1,000 mLs Intravenous New Bag/Given 04/15/17 2002)  metoCLOPramide (REGLAN) injection 10 mg (10 mg Intravenous Given 04/15/17 1959)     Initial Impression / Assessment and Plan / ED Course  I have reviewed the triage vital signs and the nursing notes.  Pertinent labs & imaging results that were available during my care of the patient were reviewed by me and considered in my medical decision making (see chart for details).   Suspect migraine headache.  Doubt infection, SAH or other emergent etiology.  Pt was treated with a migraine cocktail. Sx improved.  Pt requested  a refill of fioricet.  Final Clinical Impressions(s) / ED Diagnoses   Final diagnoses:  Migraine without status migrainosus, not intractable, unspecified migraine type    New Prescriptions Current Discharge Medication List       Linwood Dibbles, MD 04/15/17 2135

## 2017-04-15 NOTE — Discharge Instructions (Signed)
Follow up with a primary care doctor,  take the medications as needed, try taking behind the counter sudafed for nasal congestion

## 2017-04-16 MED FILL — BUTALB-ACETAMIN-CAFF 50-325: 50-325-40 | 3 days supply | Qty: 12 | Fill #0

## 2017-04-17 ENCOUNTER — Encounter: Payer: Self-pay | Admitting: Internal Medicine

## 2017-04-17 ENCOUNTER — Ambulatory Visit (INDEPENDENT_AMBULATORY_CARE_PROVIDER_SITE_OTHER): Payer: Self-pay | Admitting: Internal Medicine

## 2017-04-17 VITALS — BP 113/72 | HR 50 | Temp 97.8°F | Ht 59.0 in | Wt 163.3 lb

## 2017-04-17 DIAGNOSIS — J329 Chronic sinusitis, unspecified: Secondary | ICD-10-CM

## 2017-04-17 DIAGNOSIS — E669 Obesity, unspecified: Secondary | ICD-10-CM

## 2017-04-17 DIAGNOSIS — Z6832 Body mass index (BMI) 32.0-32.9, adult: Secondary | ICD-10-CM

## 2017-04-17 DIAGNOSIS — G43109 Migraine with aura, not intractable, without status migrainosus: Secondary | ICD-10-CM

## 2017-04-17 DIAGNOSIS — R102 Pelvic and perineal pain: Secondary | ICD-10-CM

## 2017-04-17 DIAGNOSIS — H6690 Otitis media, unspecified, unspecified ear: Secondary | ICD-10-CM

## 2017-04-17 DIAGNOSIS — F320 Major depressive disorder, single episode, mild: Secondary | ICD-10-CM

## 2017-04-17 MED ORDER — FLUCONAZOLE 150 MG PO TABS: 150.0000 mg | ORAL_TABLET | Freq: Once | ORAL | 1 refills | Status: AC

## 2017-04-17 MED ORDER — AMOXICILLIN 500 MG PO TABS: 500.0000 mg | ORAL_TABLET | Freq: Two times a day (BID) | ORAL | 0 refills | Status: DC

## 2017-04-17 MED ORDER — TOPIRAMATE 25 MG PO TABS: 25.0000 mg | ORAL_TABLET | Freq: Two times a day (BID) | ORAL | 2 refills | Status: DC

## 2017-04-17 MED FILL — AMOXICILLIN 500 MG CAPSULE: 500 | 7 days supply | Qty: 14 | Fill #0

## 2017-04-17 MED FILL — FLUCONAZOLE 150 MG TABLET: 150 | 1 days supply | Qty: 1 | Fill #0

## 2017-04-17 MED FILL — TOPIRAMATE 25 MG TABLET: 25 | 30 days supply | Qty: 60 | Fill #0

## 2017-04-17 NOTE — Progress Notes (Signed)
   CC: follow-up of congestion, migraines.   HPI:  [name] is a 21 y.o. F here for follow-up of her chronic medical conditions as outlined below. She also has acute complaint of ear pain, congestion and fevers.   Complains of 2 week hx of ear pain, congestion, stuffy nose, fevers and chills. Still using Flonase. Also notes a nearly life-long history of nasal congestion.   Migraines: Seen in ED 2x since our visit last month for migraines. Given Rx for Fioricet which she has taken with relief however does complain of worse rebound headache. Has missed 4 days of work in the past 2 weeks because of this. Was seen previously by Dr. Catalina Lunger, headache specialist, and was prescribed something "sprinkles." (Topamax sprinkles). This medication reportedly worked very well however she stopped taking it and lost insurance.   Depression: Started Lexapro 10 last visit. PHQ9-16 at that time. Feels much better. PHQ-9 7 today.   Weight gain: Continues to request "diet pill." Notes family history of hypothyroidism.   Past Medical History:  Diagnosis Date  . Asthma   . Migraine   . Seizures (HCC)    Review of Systems:   General: Denies fevers, chills, weight loss HEENT: +Stuffy nose, +Ear pain, +tinitius, Denies changes in vision (except for during migraines), sore throat or dysphagia Cardiac: Denies CP, SOB, palpitations Pulmonary: Denies cough, wheezes, PND Abd: Denies diarrhea, constipation, changes in bowels Extremities: Denies weakness or swelling   Physical Exam: General: Alert, in no acute distress. Pleasant and conversant HEENT: +Swollen nasal turbinates, inferior. +Nasal polyp L nare. +Fluid behind ears, bubbles. Slight redness. No icterus, injection or ptosis. No hoarseness or dysarthria  Cardiac: RRR, no MGR appreciated Pulmonary: CTA BL with normal WOB on RA. Able to speak in complete sentences Abd: Soft, non-tended. +bs Extremities: Warm, perfused. No significant pedal edema.     Vitals:   04/17/17 1605  BP: 113/72  Pulse: (!) 50  Temp: 97.8 F (36.6 C)  TempSrc: Oral  SpO2: 100%  Weight: 163 lb 4.8 oz (74.1 kg)  Height: 4\' 11"  (1.499 m)   Assessment & Plan:   See Encounters Tab for problem based charting.  Patient seen with Dr. Criselda Peaches

## 2017-04-17 NOTE — Patient Instructions (Signed)
It was great seeing you today! I'm glad you are doing ok.   I'm sorry to hear you are still having migraines. For this, I am starting you on a medication called Topamax. This medicine is taken daily and works to decrease the frequency and severity of headaches. We will start this slowly and gradually increase according to the schedule below: -First week: 25mg  once daily -Second week: 25 mg twice daily  Please limit your use of Fioricet if possible.   Today we also talked about your ear and nose issues. For this I am referring you to an ENT (ear nose and throat doctor) for a closer look. I am also prescribing you a short course of Amoxicillin as there might be a bit of an infection in your ears. Please continue taking your allergy medicine including your nasal spray.   Today we also talked about your depression and weight. I'm glad you are doing well with Lexapro! We are going to do a blood test today to check your thyroid.   Please see me in 1 month.

## 2017-04-18 DIAGNOSIS — J329 Chronic sinusitis, unspecified: Secondary | ICD-10-CM | POA: Insufficient documentation

## 2017-04-18 DIAGNOSIS — G43909 Migraine, unspecified, not intractable, without status migrainosus: Secondary | ICD-10-CM | POA: Insufficient documentation

## 2017-04-18 DIAGNOSIS — H6690 Otitis media, unspecified, unspecified ear: Secondary | ICD-10-CM | POA: Insufficient documentation

## 2017-04-18 LAB — TSH: TSH: 1.2 u[IU]/mL (ref 0.450–4.500)

## 2017-04-18 NOTE — Assessment & Plan Note (Signed)
Pt and mother describe a long hx of migraines. Previously seen by a headache specialist (Dr. Catalina Lunger) who prescribed topamax sprinkles (she was having dysphagia with pills at that time) with excellent result. She has been seen twice in the ED since I saw her a month ago and describes several migraines a week. At this point, I believe she needs prophylactic and suppresive therapy for her migraines. She requests another trial of Topamax.  -Good discussion was had on the need for consistent birth control with topamax -Good discussion was also held about the risk of worsening depression while on topamax in young adults. States she didn't remember having this problem while on this medicine and that she denies any hx of SI.  -Start topamax 25 mg daily x1 week then increase to BID from then on. Pt to call clinic if any adverse effects -RTC 1 month for follow up

## 2017-04-18 NOTE — Assessment & Plan Note (Signed)
Lifelong history of nasal congestion and difficulty breathing. Exam with edema, most pronounced in the inferior nares BL, making her nasal passage quite small. ?Polyp left nare. She is consistent with her flonase and zyrtec. I believe she would benefit from ENT evaluation.  -Refer ENT -Continue Zyrtec, flonase

## 2017-04-18 NOTE — Assessment & Plan Note (Signed)
Much improved on lexapro 10 mg. PHQ-9 today was 7.

## 2017-04-18 NOTE — Assessment & Plan Note (Signed)
2 week hx of ear pain, fullness, ringing with associated nasal congestion and sore throat. Patient has been using her flonase consistently. She describes fevers and chills and physical exam with erythematous TMs with bubbles.  -Rx for Amoxicillin 500mg  BID x 7 days -Rx for diflucan as pt reports she gets yeast infections easily with antibiotics

## 2017-04-18 NOTE — Assessment & Plan Note (Signed)
Still with complaints of dyspareunia. No discharge or abnormal bleeding. Will refer to OBGY[facility], now has green card.

## 2017-04-18 NOTE — Assessment & Plan Note (Signed)
Checking TSH as patient has had progressive weight gain despite adequate diet and exercise, per patient. Father with hx of hypothyroidism.  -Check TSH

## 2017-04-19 NOTE — Progress Notes (Signed)
Internal Medicine Clinic Attending  I saw and evaluated the patient.  I personally confirmed the key portions of the history and exam documented by Dr. Molt and I reviewed pertinent patient test results.  The assessment, diagnosis, and plan were formulated together and I agree with the documentation in the resident's note. 

## 2017-04-23 ENCOUNTER — Ambulatory Visit (INDEPENDENT_AMBULATORY_CARE_PROVIDER_SITE_OTHER): Payer: Self-pay | Admitting: Internal Medicine

## 2017-04-23 ENCOUNTER — Encounter: Payer: Self-pay | Admitting: Internal Medicine

## 2017-04-23 VITALS — BP 124/75 | HR 86 | Temp 98.0°F | Ht 59.0 in | Wt 160.1 lb

## 2017-04-23 DIAGNOSIS — G43109 Migraine with aura, not intractable, without status migrainosus: Secondary | ICD-10-CM

## 2017-04-23 DIAGNOSIS — G43909 Migraine, unspecified, not intractable, without status migrainosus: Secondary | ICD-10-CM

## 2017-04-23 DIAGNOSIS — R197 Diarrhea, unspecified: Secondary | ICD-10-CM

## 2017-04-23 DIAGNOSIS — L299 Pruritus, unspecified: Secondary | ICD-10-CM

## 2017-04-23 DIAGNOSIS — Z09 Encounter for follow-up examination after completed treatment for conditions other than malignant neoplasm: Secondary | ICD-10-CM

## 2017-04-23 DIAGNOSIS — J45909 Unspecified asthma, uncomplicated: Secondary | ICD-10-CM

## 2017-04-23 DIAGNOSIS — H6690 Otitis media, unspecified, unspecified ear: Secondary | ICD-10-CM

## 2017-04-23 DIAGNOSIS — Z7951 Long term (current) use of inhaled steroids: Secondary | ICD-10-CM

## 2017-04-23 DIAGNOSIS — Z79899 Other long term (current) drug therapy: Secondary | ICD-10-CM

## 2017-04-23 DIAGNOSIS — Z978 Presence of other specified devices: Secondary | ICD-10-CM

## 2017-04-23 DIAGNOSIS — K219 Gastro-esophageal reflux disease without esophagitis: Secondary | ICD-10-CM

## 2017-04-23 DIAGNOSIS — J329 Chronic sinusitis, unspecified: Secondary | ICD-10-CM

## 2017-04-23 DIAGNOSIS — Z8669 Personal history of other diseases of the nervous system and sense organs: Secondary | ICD-10-CM

## 2017-04-23 DIAGNOSIS — R0982 Postnasal drip: Secondary | ICD-10-CM | POA: Insufficient documentation

## 2017-04-23 NOTE — Progress Notes (Signed)
   CC: Otitis Media Follow-up  HPI:  [name] is a 21 y.o. with PMHx significant for allergic rhinitis and asthma who presented for follow-up after recent diagnosis of Otitis Media. She was evaluated on 8/21 by Dr. Vincente Liberty who diagnosed her with otitis Media and prescribed Amoxicillin 500 mg BID x 7 days and instructions to continue her Flonase and other medications. He ear pain has greatly improved, but does have some itching of her ears. She continues to have nasal congestion and drainage but denies fevers, chills, N/V, positional HA, visual changes, sore throat.   Review of history is significant for Asthma for which she takes On Proventil, Symbicort, and Singulair. She has has chronic sinusitis and takes Zyrtec 10 mg daily, Flonase, and occasionally Afrin.   She voices concerns about a new cough of 1 day duration. It is non-productive and different than prior cough related to her asthma and GERD. She does acknowledge increased drainage in her posterior throat. Seems to be worse in the AM. Does have some diarrhea as well.   She was recently started on Topamax for her frequent migraines. Initially experienced brain fog or cognitive slowly but it has since improved. Noticed some increased fatigue. No migraines since starting the medication.   Past Medical History:  Diagnosis Date  . Asthma   . Migraine   . Seizures (HCC)    Review of Systems  Constitutional: Positive for malaise/fatigue. Negative for chills and fever.  HENT: Positive for congestion. Negative for ear pain, sinus pain and sore throat.   Eyes: Negative for blurred vision and pain.  Respiratory: Positive for cough. Negative for sputum production, shortness of breath and wheezing.   Cardiovascular: Negative.   Gastrointestinal: Positive for diarrhea. Negative for abdominal pain, nausea and vomiting.  Genitourinary: Negative.   Musculoskeletal: Negative for joint pain and myalgias.  Skin: Positive for itching.    Physical Exam: Vitals:   04/23/17 0942  BP: 124/75  Pulse: 86  Temp: 98 F (36.7 C)  TempSrc: Oral  SpO2: 100%  Weight: 160 lb 1.6 oz (72.6 kg)  Height: 4\' 11"  (1.499 m)   Physical Exam  Constitutional: She is oriented to person, place, and time. She appears well-developed and well-nourished.  HENT:  Head: Normocephalic and atraumatic.  Mouth/Throat: Oropharynx is clear and moist.  TM clear with good light reflex bilaterally. No erythema or effusions noted  Eyes: Pupils are equal, round, and reactive to light. Conjunctivae are normal.  Cardiovascular: Normal rate, regular rhythm, normal heart sounds and intact distal pulses.   Pulmonary/Chest: Effort normal and breath sounds normal. She has no wheezes.  Abdominal: Soft. Bowel sounds are normal. There is no tenderness.  Musculoskeletal: Normal range of motion. She exhibits no edema.  Lymphadenopathy:    She has no cervical adenopathy.  Neurological: She is alert and oriented to person, place, and time.   Assessment & Plan:   See Encounters Tab for problem based charting.  Patient seen with Dr. Heide Spark

## 2017-04-23 NOTE — Assessment & Plan Note (Signed)
History of asthma and chronic nasal congestion. Currently using zyrtec and Flonase which provide some relief. She also uses Afrin intermittently. 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 Discussed that she can also try saline rises to see if that improves her symptoms. States that she has had prior evaluation by an allergist that illustrated normal labs including IgE and eosinophil count.   Plan: - Avoid Afrin if possible due to risk of rhinitis medicamentosa - Try saline nasal rises  - Continue Flonase and Zyrtec

## 2017-04-23 NOTE — Assessment & Plan Note (Addendum)
Patient presents for follow-up after diagnosis of otitis media 7 days prior. At the time she was prescribed Amoxicillin 500 mg BID for 7 days. She acknowledges a new cough of 1 day duration. She has had increased nasal drainage over the past 4 weeks. History remarkable for asthma and GERD. States that the cough is different than she typically experiences with her asthma and GERD. Likely diagnosis is post nasal drip. However, this could be a post viral bronchitis.   Plan: - No indication for antibiotics at this point - Discussed this will likely get better with time. If cough persists for >4 weeks return for further evaluation

## 2017-04-23 NOTE — Patient Instructions (Signed)
It was a pleasure to meet you today. Please come back and see [place] if your cough does not improve. As we discussed, I think this is likely related to your recent infection. Continue to take all your medications as prescribed.

## 2017-04-23 NOTE — Assessment & Plan Note (Signed)
Long history of migraine headaches. She was recently started on Topamax 25 mg QD after a negative pregnancy test. She endorses some initial cognitive slowing or "brain fog" but states that it has gotten better. Endorses some fatigue. No migraines since starting the medication. Current birth control with Implanon.   Plan: - Continue Topamax 25 mg QD

## 2017-04-24 ENCOUNTER — Emergency Department (HOSPITAL_COMMUNITY)
Admission: EM | Admit: 2017-04-24 | Discharge: 2017-04-24 | Disposition: A | Discharge status: Home / Self Care | Payer: Self-pay | Attending: Emergency Medicine | Admitting: Emergency Medicine

## 2017-04-24 ENCOUNTER — Emergency Department (HOSPITAL_COMMUNITY): Payer: Self-pay

## 2017-04-24 ENCOUNTER — Encounter (HOSPITAL_COMMUNITY): Payer: Self-pay | Admitting: Emergency Medicine

## 2017-04-24 DIAGNOSIS — Y9289 Other specified places as the place of occurrence of the external cause: Secondary | ICD-10-CM | POA: Insufficient documentation

## 2017-04-24 DIAGNOSIS — J45909 Unspecified asthma, uncomplicated: Secondary | ICD-10-CM | POA: Insufficient documentation

## 2017-04-24 DIAGNOSIS — S91115A Laceration without foreign body of left lesser toe(s) without damage to nail, initial encounter: Secondary | ICD-10-CM | POA: Insufficient documentation

## 2017-04-24 DIAGNOSIS — Z79899 Other long term (current) drug therapy: Secondary | ICD-10-CM | POA: Insufficient documentation

## 2017-04-24 DIAGNOSIS — S61521A Laceration with foreign body of right wrist, initial encounter: Secondary | ICD-10-CM | POA: Insufficient documentation

## 2017-04-24 DIAGNOSIS — W458XXA Other foreign body or object entering through skin, initial encounter: Secondary | ICD-10-CM | POA: Insufficient documentation

## 2017-04-24 DIAGNOSIS — Y93E6 Activity, residential relocation: Secondary | ICD-10-CM | POA: Insufficient documentation

## 2017-04-24 DIAGNOSIS — Y998 Other external cause status: Secondary | ICD-10-CM | POA: Insufficient documentation

## 2017-04-24 MED ORDER — BACITRACIN ZINC 500 UNIT/GM EX OINT
TOPICAL_OINTMENT | Freq: Once | CUTANEOUS | Status: AC
  Administered 2017-04-24: 1 via TOPICAL

## 2017-04-24 MED ORDER — LIDOCAINE HCL (PF) 1 % IJ SOLN
5.0000 mL | Freq: Once | INTRAMUSCULAR | Status: AC
  Administered 2017-04-24: 5 mL
  Filled 2017-04-24: qty 30

## 2017-04-24 NOTE — ED Notes (Signed)
Bed: WTR8 Expected date:  Expected time:  Means of arrival:  Comments: 

## 2017-04-24 NOTE — Progress Notes (Signed)
Internal Medicine Clinic Attending  I saw and evaluated the patient.  I personally confirmed the key portions of the history and exam documented by Dr. Helberg and I reviewed pertinent patient test results.  The assessment, diagnosis, and plan were formulated together and I agree with the documentation in the resident's note. 

## 2017-04-24 NOTE — ED Triage Notes (Signed)
Pt c/o hand and toe injury after dropping glass on hands and feet on Sunday. Intermittent tingling in left foot, foreign body sensation to right hand.  No recent tetanus shot.

## 2017-04-24 NOTE — Discharge Instructions (Signed)
I removed the foreign body from your right wrist.  Keep the area clean and return for any problems.  Clean the wounds to the toes and keep them covered when you are out. At home you can leave the wound open and elevate the foot.

## 2017-04-24 NOTE — ED Provider Notes (Signed)
[facility]-EMERGENCY DEPT Provider Note   CSN: [id number] Arrival date & time: 04/24/17  4098     History   Chief Complaint Chief Complaint  Patient presents with  . Hand Pain  . Toe Pain    HPI Shelby Pittman is a 21 y.o. female who presents to the ED with possible FB to the right wrist after glass broke while she was moving things in a storage unit 2 days ago. She has has a cut to the 3rd and 4th toes on the left foot. Patient reports being up to date on tetanus. She denies any other injuries.  HPI  Past Medical History:  Diagnosis Date  . Asthma   . Migraine   . Seizures [facility])     Patient Active Problem List   Diagnosis Date Noted  . Post-nasal drip 04/23/2017  . Chronic sinusitis 04/18/2017  . Migraine 04/18/2017  . Depression 03/14/2017  . Vaginal pain 03/13/2017  . Obesity (BMI 30-39.9) 01/23/2017  . Asthma 09/28/2016  . Allergic rhinitis 09/28/2016  . Tobacco abuse counseling 09/28/2016  . Uses contraceptive implants as primary birth control method 09/28/2016    Past Surgical History:  Procedure Laterality Date  . FRACTURE SURGERY      OB History    No data available       Home Medications    Prior to Admission medications   Medication Sig Start Date End Date Taking? Authorizing Provider  albuterol (PROVENTIL HFA;VENTOLIN HFA) 108 (90 Base) MCG/ACT inhaler Inhale 2 puffs into the lungs every 6 (six) hours as needed for wheezing. 09/28/16   Molt, Bethany, DO  amoxicillin (AMOXIL) 500 MG tablet Take 1 tablet (500 mg total) by mouth 2 (two) times daily. 04/17/17   Molt, Bethany, DO  budesonide-formoterol (SYMBICORT) 160-4.5 MCG/ACT inhaler Inhale 2 puffs into the lungs every evening. 01/23/17   Hyacinth Meeker, MD  butalbital-acetaminophen-caffeine (FIORICET, ESGIC) [phone] MG tablet Take 1 tablet by mouth every 6 (six) hours as needed for headache. 04/15/17   Linwood Dibbles, MD  cetirizine (ZYRTEC) 10 MG tablet Take 1 tablet (10 mg total) by mouth daily. 09/28/16    Molt, Bethany, DO  escitalopram (LEXAPRO) 10 MG tablet Take 1 tablet (10 mg total) by mouth daily. Patient not taking: Reported on 04/12/2017 03/13/17 03/13/18  Molt, Bethany, DO  etonogestrel (IMPLANON) 68 MG IMPL implant Inject 1 each into the skin once. Received implant 01/2013    [provider]  fluticasone (FLONASE) 50 MCG/ACT nasal spray Place 2 sprays into both nostrils daily. 09/28/16 09/28/17  Molt, Bethany, DO  montelukast (SINGULAIR) 10 MG tablet Take 1 tablet (10 mg total) by mouth daily. 10/11/16 10/11/17  Molt, Bethany, DO  ondansetron (ZOFRAN ODT) 4 MG disintegrating tablet Take 1 tablet (4 mg total) by mouth every 8 (eight) hours as needed for nausea or vomiting. Patient not taking: Reported on 04/12/2017 04/05/17   Dowless, Lelon Mast Tripp, PA-C  oxymetazoline (AFRIN NASAL SPRAY) 0.05 % nasal spray Place 1 spray into both nostrils 2 (two) times daily. Patient not taking: Reported on 04/12/2017 04/05/17   Dowless, Lelon Mast Tripp, PA-C  topiramate (TOPAMAX) 25 MG tablet Take 1 tablet (25 mg total) by mouth 2 (two) times daily. 04/17/17 04/17/18  Molt, Bethany, DO    Family History Family History  Problem Relation Age of Onset  . Hypertension Mother   . Hypertension Other   . Diabetes Other     Social History Social History  Substance Use Topics  . Smoking status: Never Smoker  .  Smokeless tobacco: Never Used     Comment: Smokes "[employer]  . Alcohol use No     Allergies   Patient has no known allergies.   Review of Systems Review of Systems  Skin: Positive for wound.       FB right wrist, wounds to left 3rd and 4th toes     Physical Exam Updated Vital Signs BP 112/78 (BP Location: Left Arm)   Pulse (!) 59   Temp 98.4 F (36.9 C) (Oral)   Resp 18   LMP 03/28/2017   SpO2 100%   Physical Exam  Constitutional: She appears well-developed and well-nourished. No distress.  Eyes: EOM are normal.  Neck: Normal range of motion. Neck supple.  Cardiovascular: Normal  rate.   Pulmonary/Chest: Effort normal.  Musculoskeletal: Normal range of motion.  Tenderness and wounds noted to the right wrist. Foreign body palpated.   Neurological: She is alert.  Skin: Skin is warm and dry.  Psychiatric: She has a normal mood and affect. Her behavior is normal.  Nursing note and vitals reviewed.    ED Treatments / Results: Lacerations to toes cleaned with NSS, bacitracin ointment and dressing applied.   Labs (all labs ordered are listed, but only abnormal results are displayed) Labs Reviewed - No data to display   Radiology Dg Wrist Complete Right  Result Date: 04/24/2017 CLINICAL DATA:  Foreign body sensation in the anterior wrist after dropping a glass table 1 minutes. EXAM: RIGHT WRIST - COMPLETE 3+ VIEW COMPARISON:  Right wrist x-rays dated May 30, 2004. FINDINGS: There is a 6 mm rectangular foreign body in the radial aspect of the anterior soft tissues of the wrist at the level of the distal carpal row, overlying the trapezium. The foreign body is 2-3 mm deep to the skin surface. There is no evidence of fracture or dislocation. There is no evidence of arthropathy or other focal bone abnormality. IMPRESSION: 6 mm radiopaque rectangular foreign body in the anterior soft tissues of the wrist at the level of the distal carpal row, overlying the trapezium. The foreign body is 2-3 mm deep to the skin surface. Electronically Signed   By: Obie Dredge M.D.   On: 04/24/2017 11:05    Procedures .Foreign Body Removal Date/Time: 04/24/2017 1:00 PM Performed by: Janne Napoleon Authorized by: Janne Napoleon  Consent: Verbal consent obtained. Consent given by: patient Patient understanding: patient states understanding of the procedure being performed Imaging studies: imaging studies available Required items: required blood products, implants, devices, and special equipment available Patient identity confirmed: verbally with patient Body area: skin General  location: upper extremity Location details: right wrist Anesthesia: local infiltration  Anesthesia: Local Anesthetic: lidocaine 1% without epinephrine Anesthetic total: 2 mL  Sedation: Patient sedated: no Patient restrained: no Patient cooperative: yes Removal mechanism: hemostat Dressing: dressing applied Tendon involvement: none Depth: subcutaneous Complexity: complex 1 objects recovered. Objects recovered: glass Post-procedure assessment: foreign body removed Patient tolerance: Patient tolerated the procedure well with no immediate complications Comments: Wound irrigated with NSS after FB removed.    (including critical care time)  Medications Ordered in ED Medications  lidocaine (PF) (XYLOCAINE) 1 % injection 5 mL (5 mLs Infiltration Given 04/24/17 1203)  bacitracin ointment (1 application Topical Given 04/24/17 1249)     Initial Impression / Assessment and Plan / ED Course  I have reviewed the triage vital signs and the nursing notes.  Pertinent imaging results that were available during my care of the patient were reviewed by  me and considered in my medical decision making (see chart for details).  Final Clinical Impressions(s) / ED Diagnoses  21 y.o. female with wounds s/p injury 2 days ago stable for d/c without focal neuro deficits and no signs of infection. Glass removed from right wrist. Patient d/c home with return precautions.   Final diagnoses:  Foreign body entering through skin, initial encounter  Laceration of third toe of left foot, initial encounter    New Prescriptions Discharge Medication List as of 04/24/2017 12:49 PM       Kerrie [place], NP 04/24/17 1836    Tilden Fossa, MD 04/25/17 [id number]

## 2017-04-26 ENCOUNTER — Ambulatory Visit (INDEPENDENT_AMBULATORY_CARE_PROVIDER_SITE_OTHER): Payer: Self-pay | Admitting: Internal Medicine

## 2017-04-26 ENCOUNTER — Encounter: Payer: Self-pay | Admitting: Internal Medicine

## 2017-04-26 VITALS — BP 120/55 | HR 61 | Temp 98.0°F | Ht 59.0 in | Wt 161.8 lb

## 2017-04-26 DIAGNOSIS — Z79899 Other long term (current) drug therapy: Secondary | ICD-10-CM

## 2017-04-26 DIAGNOSIS — J3089 Other allergic rhinitis: Secondary | ICD-10-CM

## 2017-04-26 MED ORDER — CETIRIZINE HCL 10 MG PO TABS: 10.0000 mg | ORAL_TABLET | Freq: Every day | ORAL | 0 refills | Status: DC

## 2017-04-26 NOTE — Assessment & Plan Note (Signed)
Patient reports having nasal congestion for the past 4 weeks with yellow nasal secretions. Denies having any sinus pressure. Reports having a sore throat and a scratchy sensation in her throat which is making her cough. Reports having subjective fevers and chills. During her previous visit, she was advised to take Zyrtec and use Flonase. Patient has been using Flonase but not Zyrtec as she ran out. She was also advised to do nasal saline irrigations which she has not been doing. She is afebrile in the clinic. On exam, noted to have clear nasal secretions with erythema of the inferior nasal turbinate bilaterally. No signs of sinusitis. Lungs clear on exam. Her symptoms are again consistent with allergic rhinitis and postnasal drip.  Plan -Advised her to continue using Flonase -Zyrtec has been refilled -Warm showers -Daily nasal saline irrigations -Over-the-counter cough drops or Delsym as needed for cough -Consider referral to ENT when she has an orange card

## 2017-04-26 NOTE — Progress Notes (Signed)
Internal Medicine Clinic Attending  Case discussed with Dr. Rathoreat the time of the visit. We reviewed the resident's history and exam and pertinent patient test results. I agree with the assessment, diagnosis, and plan of care documented in the resident's note.  

## 2017-04-26 NOTE — Progress Notes (Signed)
   CC: Nasal congestion and cough  HPI:  [name] is a 21 y.o. female with a past medical history of conditions listed below presenting to the clinic complaining of nasal congestion and cough. Please see problem based charting for the status of the patient's current and chronic medical conditions.   Past Medical History:  Diagnosis Date  . Asthma   . Migraine   . Seizures (HCC)    Review of Systems: Pertinent positives mentioned in HPI. Remainder of all ROS negative.   Physical Exam:  Vitals:   04/26/17 1102  BP: (!) 120/55  Pulse: 61  Temp: 98 F (36.7 C)  TempSrc: Oral  SpO2: 100%  Weight: 161 lb 12.8 oz (73.4 kg)  Height: 4\' 11"  (1.499 m)   Physical Exam  Constitutional: She is oriented to person, place, and time. She appears well-developed and well-nourished. No distress.  HENT:  Head: Normocephalic and atraumatic.  Right Ear: External ear normal.  Left Ear: External ear normal.  Mouth/Throat: Oropharynx is clear and moist. No oropharyngeal exudate.  No oropharyngeal erythema Inferior nasal turbinate erythematous bilaterally with clear nasal secretions. Ears bilaterally: Tympanic membrane pearly white and light reflex present. No discharge, erythema, perforation, bulging or effusion noted. Sinuses nontender to palpation.  Eyes: Right eye exhibits no discharge. Left eye exhibits no discharge.  Cardiovascular: Normal rate, regular rhythm and intact distal pulses.   Pulmonary/Chest: Effort normal and breath sounds normal. No respiratory distress. She has no wheezes. She has no rales.  Abdominal: Soft. Bowel sounds are normal. She exhibits no distension. There is no tenderness.  Musculoskeletal: She exhibits no edema.  Lymphadenopathy:    She has no cervical adenopathy.  Neurological: She is alert and oriented to person, place, and time.  Skin: Skin is warm and dry.    Assessment & Plan:   See Encounters Tab for problem based charting.  Patient  discussed with Dr. Heide SparkNarendra

## 2017-04-26 NOTE — Patient Instructions (Addendum)
Shelby Pittman it was nice meeting you today.  -Continue using Flonase nasal spray  -Take Zyrtec daily  -Please purchase a Neti pot kit from the pharmacy to do daily nasal saline irrigations  -Take warm showers  -Please let the clinic know when you receive an orange card so that referral to ENT can be placed.  -It may take up to 4 weeks before you experience any improvement in your symptoms.

## 2017-05-10 ENCOUNTER — Encounter: Payer: Self-pay | Admitting: *Deleted

## 2017-05-14 MED FILL — SYMBICORT 160-4.5 MCG INH: 160-4.5 | 30 days supply | Qty: 10 | Fill #1

## 2017-05-21 MED FILL — TOPIRAMATE 25 MG TAB: 25 | 30 days supply | Qty: 60 | Fill #1

## 2017-06-07 NOTE — Addendum Note (Signed)
Addended by: Neomia Dear on: 06/07/2017 04:37 PM   Modules accepted: Orders

## 2017-06-21 ENCOUNTER — Ambulatory Visit: Payer: Self-pay

## 2017-06-26 ENCOUNTER — Encounter (INDEPENDENT_AMBULATORY_CARE_PROVIDER_SITE_OTHER): Payer: Self-pay

## 2017-06-26 ENCOUNTER — Ambulatory Visit (INDEPENDENT_AMBULATORY_CARE_PROVIDER_SITE_OTHER): Payer: Self-pay | Admitting: Internal Medicine

## 2017-06-26 VITALS — BP 109/56 | HR 51 | Temp 97.5°F | Ht 59.0 in | Wt 160.0 lb

## 2017-06-26 DIAGNOSIS — R0602 Shortness of breath: Secondary | ICD-10-CM

## 2017-06-26 DIAGNOSIS — R001 Bradycardia, unspecified: Secondary | ICD-10-CM

## 2017-06-26 DIAGNOSIS — J3089 Other allergic rhinitis: Secondary | ICD-10-CM

## 2017-06-26 DIAGNOSIS — H9319 Tinnitus, unspecified ear: Secondary | ICD-10-CM

## 2017-06-26 DIAGNOSIS — R05 Cough: Secondary | ICD-10-CM

## 2017-06-26 DIAGNOSIS — L299 Pruritus, unspecified: Secondary | ICD-10-CM

## 2017-06-26 DIAGNOSIS — R0981 Nasal congestion: Secondary | ICD-10-CM

## 2017-06-26 DIAGNOSIS — J3489 Other specified disorders of nose and nasal sinuses: Secondary | ICD-10-CM

## 2017-06-26 DIAGNOSIS — Z8709 Personal history of other diseases of the respiratory system: Secondary | ICD-10-CM

## 2017-06-26 DIAGNOSIS — J45909 Unspecified asthma, uncomplicated: Secondary | ICD-10-CM

## 2017-06-26 DIAGNOSIS — R609 Edema, unspecified: Secondary | ICD-10-CM

## 2017-06-26 MED ORDER — FLUTICASONE PROPIONATE 50 MCG/ACT NA SUSP: 2.0000 | Freq: Every day | NASAL | 0 refills | Status: DC

## 2017-06-26 MED ORDER — CETIRIZINE HCL 10 MG PO TABS: 10.0000 mg | ORAL_TABLET | Freq: Every day | ORAL | 0 refills | Status: DC

## 2017-06-26 NOTE — Patient Instructions (Signed)
It was a pleasure to see you [name].  We will restart your Zyrtec for your allergic rhinitis symptoms. Continue your Flonase which I have refilled.  Try a nasal saline spray throughout the day for sinus irrigation.  Please continue your other medications as prescribed.  Try to drink plenty of fluids to stay hydrated.  Please follow up with Dr. Vincente LibertyMolt in about 4 months or see us sooner if needed.

## 2017-06-26 NOTE — Assessment & Plan Note (Signed)
Patients upper respiratory symptoms are consistent with rhinitis. Her diarrhea has resolved and may have been from a mild gastroenteritis. She does have a history of frequent rhinitis symptoms and was seen 2 months ago for similar symptoms with sinusitis. She did have a CT head 2 months ago in the ED for headache which did not show any acute findings in her sinuses, although it was not a dedicated sinus image. She does report relief when she is using daily zyrtec and per prior notes her symptoms flared up when she ran out 2 months ago as well. With regards to her ear pruritus, both her ears canals are normal on examination without cerumen, discharge, or abnormality of the tympanic membrane. Will recommend she continue her current medications as prescribed and restart Flonase and daily frequent nasal saline irrigation.  - Continue Flonase 2 sprays each nostril daily - Refilled Zyrtec 10 mg daily - Continue Symbicort 160-4.5 mcg 2 puffs daily - Continue Albuterol 2 puffs q6h prn wheezing or SOB - Continue Singulair 10 mg daily - Frequent Nasal saline irrigation

## 2017-06-26 NOTE — Progress Notes (Signed)
CC: Allergic rhinitis  HPI:  Shelby Pittman is a 21 y.o. female with PMH of reported childhood asthma who presents for evaluation of rhinitis symptoms.  Patient states that she woke up around 2 am last week and noticed symptoms of chills and diarrhea. She also began to have a non productive cough. She says the diarrhea and chills resolved after 1-2 days. Her cough persisted with associated clear rhinorrhea, nasal congestion, sinus tenderness, sneezing, pruritic ears, and occasional ringing in her ears. She has been using Flonase daily prior to her symptoms. She has previously used Zyrtec, but ran out weeks ago. She has felt intermittent shortness of breath and occasional wheezing. She has been using her daily Symbicort up to three times per day as she felt it was providing more relief than her rescue Albuterol inhaler. She has also been taking Singulair daily. She denies any fever, nausea, vomiting, or sore throat, or post-nasal drip.  Past Medical History:  Diagnosis Date  . Asthma   . Migraine   . Seizures (HCC)    Review of Systems:   Review of Systems  Constitutional: Negative for fever.  HENT: Positive for congestion and sinus pain. Negative for sore throat.        Clear rhinorrhea, sneezing, itchy ears, occasional ringing in ears  Respiratory: Positive for cough, shortness of breath and wheezing. Negative for hemoptysis and sputum production.   Gastrointestinal: Negative for nausea and vomiting.  Musculoskeletal: Negative for myalgias.     Physical Exam:  Vitals:   06/26/17 1115  BP: (!) 109/56  Pulse: (!) 51  Temp: (!) 97.5 F (36.4 C)  TempSrc: Oral  SpO2: 100%  Weight: 160 lb (72.6 kg)  Height: 4\' 11"  (1.499 m)   Physical Exam  Constitutional: She is oriented to person, place, and time. She appears well-developed and well-nourished. No distress.  HENT:  Head: Normocephalic and atraumatic.  Right Ear: Tympanic membrane and ear canal normal. No drainage. No  middle ear effusion.  Left Ear: Tympanic membrane and ear canal normal. No drainage.  No middle ear effusion.  Nose: Mucosal edema and rhinorrhea present. Right sinus exhibits no maxillary sinus tenderness and no frontal sinus tenderness. Left sinus exhibits no maxillary sinus tenderness and no frontal sinus tenderness.  Mouth/Throat: Oropharynx is clear and moist. No oropharyngeal exudate.  Neck: Neck supple.  Cardiovascular: Regular rhythm.   No murmur heard. bradycardic  Pulmonary/Chest: Effort normal. No respiratory distress. She has no wheezes. She has no rales.  Lymphadenopathy:    She has no cervical adenopathy.  Neurological: She is alert and oriented to person, place, and time.  Skin: Skin is warm. She is not diaphoretic.  Psychiatric: She has a normal mood and affect.    Assessment & Plan:   See Encounters Tab for problem based charting.  Patient discussed with Dr. Rogelia Boga  Allergic rhinitis Patients upper respiratory symptoms are consistent with rhinitis. Her diarrhea has resolved and may have been from a mild gastroenteritis. She does have a history of frequent rhinitis symptoms and was seen 2 months ago for similar symptoms with sinusitis. She did have a CT head 2 months ago in the ED for headache which did not show any acute findings in her sinuses, although it was not a dedicated sinus image. She does report relief when she is using daily zyrtec and per prior notes her symptoms flared up when she ran out 2 months ago as well. With regards to her ear pruritus, both her ears  canals are normal on examination without cerumen, discharge, or abnormality of the tympanic membrane. Will recommend she continue her current medications as prescribed and restart Flonase and daily frequent nasal saline irrigation.  - Continue Flonase 2 sprays each nostril daily - Refilled Zyrtec 10 mg daily - Continue Symbicort 160-4.5 mcg 2 puffs daily - Continue Albuterol 2 puffs q6h prn wheezing or  SOB - Continue Singulair 10 mg daily - Frequent Nasal saline irrigation

## 2017-06-27 MED FILL — TOPIRAMATE 25 MG TAB: 25 | 30 days supply | Qty: 60 | Fill #2

## 2017-07-02 NOTE — Progress Notes (Signed)
Internal Medicine Clinic Attending  Case discussed with Dr. Patel at the time of the visit.  We reviewed the resident's history and exam and pertinent patient test results.  I agree with the assessment, diagnosis, and plan of care documented in the resident's note.  

## 2017-07-17 ENCOUNTER — Ambulatory Visit (INDEPENDENT_AMBULATORY_CARE_PROVIDER_SITE_OTHER): Payer: Self-pay | Admitting: Internal Medicine

## 2017-07-17 ENCOUNTER — Encounter: Payer: Self-pay | Admitting: Internal Medicine

## 2017-07-17 ENCOUNTER — Other Ambulatory Visit: Payer: Self-pay

## 2017-07-17 VITALS — BP 124/67 | HR 80 | Temp 98.1°F | Wt 154.5 lb

## 2017-07-17 DIAGNOSIS — J309 Allergic rhinitis, unspecified: Secondary | ICD-10-CM

## 2017-07-17 DIAGNOSIS — J3089 Other allergic rhinitis: Secondary | ICD-10-CM

## 2017-07-17 MED ORDER — CETIRIZINE HCL 10 MG PO TABS: 10.0000 mg | ORAL_TABLET | Freq: Every day | ORAL | 11 refills | Status: DC

## 2017-07-17 NOTE — Progress Notes (Signed)
   CC: follow-up of allergic rhinitis  HPI:  [name] is a 21 y.o. F with asthma, migraines, depression and allergies here for follow-up. Patient reports no refill was sent of the Cetirizine to the pharmacy after her last visit and has continued to experience clear rhinorrhea, nasal congestion, sneezing and post-nasal drip. She has continued to use Flonase intermittently.   Past Medical History:  Diagnosis Date  . Asthma   . Migraine   . Seizures (HCC)    Review of Systems:   General: Denies fevers, chills HEENT: +Sore throat, itchy watery eyes. + Scratchy posterior throat. +Sneezing. Denies changes in vision Cardiac: Denies CP, SOB, palpitations Pulmonary: +Dry cough. Denies wheezes Abd: Denies diarrhea, constipation, changes in bowels Extremities: Denies weakness or swelling  Physical Exam: General: Alert, in no acute distress. Pleasant.  HEENT: Congested. No sinus tenderness. Edema of inferior nasal turbinates BL with clear rhinorrhea. +Cobblestoning of posterior pharynx. No exudate appreciated. +Injection BL conjunctiva.  Cardiac: RRR, no MGR appreciated Pulmonary: CTA BL with normal WOB on RA. Able to speak in complete sentences Abd: Soft, non-tended. +bs Extremities: Warm, perfused. No significant pedal edema.   Vitals:   07/17/17 1440  BP: 124/67  Pulse: 80  Temp: 98.1 F (36.7 C)  TempSrc: Oral  SpO2: 100%  Weight: 154 lb 8 oz (70.1 kg)    Assessment & Plan:   See Encounters Tab for problem based charting.  Patient discussed with Dr. Josem KaufmannKlima

## 2017-07-17 NOTE — Assessment & Plan Note (Signed)
Patient reports not receiving refill of Cetirizine after her last appointment and has continued to experience symptoms consistent with allergic rhinitis. She states these symptoms resolve when she was able to take the medication. She was not aware this in an OTC medication. -Sent in refills of Cetirizine however encouraged patient to purchase OTC version if unable to obtain at the pharmacy counter -Patient to continue remainder of therapy with flonase, symbicort, albuteorl prn, and singulair

## 2017-07-17 NOTE — Patient Instructions (Signed)
It was good seeing you! I'm sorry your symptoms have not resolved.   I have sent in a prescription for the Cetirizine (Zyrtec) [facility] with 11 refills. Please call me if there are any issues obtaining this medicine. It is an over-the-counter medication which can be found at any drug store. It is OK to get the generic version if you arent able to get the prescription for it. Please continue taking Flonase and Singulair.   I'm glad you are doing well otherwise!

## 2017-07-19 NOTE — Addendum Note (Signed)
Addended by: Doneen PoissonKLIMA, Zekiah Coen D on: 07/19/2017 02:25 PM   Modules accepted: Level of Service

## 2017-07-19 NOTE — Progress Notes (Signed)
Patient ID: Shelby Pittman, female   DOB: 11/17/1995, 21 y.o.   MRN: [id number]  Case discussed with Dr. Vincente LibertyMolt soon after the resident saw the patient. We reviewed the resident's history and exam and pertinent patient test results. I agree with the assessment, diagnosis, and plan of care documented in the resident's note.

## 2017-08-09 ENCOUNTER — Other Ambulatory Visit: Payer: Self-pay | Admitting: Internal Medicine

## 2017-08-10 MED FILL — TOPIRAMATE 25 MG TAB: 25 | 30 days supply | Qty: 60 | Fill #0

## 2017-08-29 MED FILL — SYMBICORT 160-4.5 MCG INH: 160-4.5 | 30 days supply | Qty: 10 | Fill #2

## 2017-09-28 MED FILL — TOPIRAMATE 25 MG TAB: 25 | 30 days supply | Qty: 60 | Fill #1

## 2017-10-18 ENCOUNTER — Encounter (HOSPITAL_COMMUNITY): Payer: Self-pay | Admitting: Emergency Medicine

## 2017-10-18 ENCOUNTER — Other Ambulatory Visit: Payer: Self-pay

## 2017-10-18 ENCOUNTER — Emergency Department (HOSPITAL_COMMUNITY)
Admission: EM | Admit: 2017-10-18 | Discharge: 2017-10-19 | Disposition: A | Discharge status: Home / Self Care | Payer: Self-pay | Attending: Emergency Medicine | Admitting: Emergency Medicine

## 2017-10-18 DIAGNOSIS — R51 Headache: Secondary | ICD-10-CM | POA: Insufficient documentation

## 2017-10-18 DIAGNOSIS — R519 Headache, unspecified: Secondary | ICD-10-CM

## 2017-10-18 MED ORDER — METOCLOPRAMIDE HCL 5 MG/ML IJ SOLN
10.0000 mg | Freq: Once | INTRAMUSCULAR | Status: AC
  Administered 2017-10-18: 10 mg via INTRAVENOUS
  Filled 2017-10-18: qty 2

## 2017-10-18 MED ORDER — DEXAMETHASONE SODIUM PHOSPHATE 10 MG/ML IJ SOLN
10.0000 mg | Freq: Once | INTRAMUSCULAR | Status: AC
  Administered 2017-10-18: 10 mg via INTRAVENOUS
  Filled 2017-10-18: qty 1

## 2017-10-18 MED ORDER — KETOROLAC TROMETHAMINE 15 MG/ML IJ SOLN
15.0000 mg | Freq: Once | INTRAMUSCULAR | Status: AC
  Administered 2017-10-18: 15 mg via INTRAVENOUS
  Filled 2017-10-18: qty 1

## 2017-10-18 MED ORDER — SODIUM CHLORIDE 0.9 % IV BOLUS (SEPSIS)
500.0000 mL | Freq: Once | INTRAVENOUS | Status: AC
  Administered 2017-10-18: 500 mL via INTRAVENOUS

## 2017-10-18 MED ORDER — DIPHENHYDRAMINE HCL 50 MG/ML IJ SOLN
25.0000 mg | Freq: Once | INTRAMUSCULAR | Status: AC
  Administered 2017-10-18: 25 mg via INTRAVENOUS
  Filled 2017-10-18: qty 1

## 2017-10-18 NOTE — ED Triage Notes (Signed)
Pt verbalizes worsening right side migraine since yesterday; hx of same.

## 2017-10-19 MED ORDER — LEVOCETIRIZINE DIHYDROCHLORIDE 5 MG PO TABS: 5.0000 mg | ORAL_TABLET | Freq: Every evening | ORAL | 0 refills | Status: DC

## 2017-10-19 NOTE — ED Provider Notes (Signed)
[facility]-EMERGENCY DEPT Provider Note   CSN: [id number] Arrival date & time: 10/18/17  1353     History   Chief Complaint Chief Complaint  Patient presents with  . Migraine    HPI Shelby Pittman is a 22 y.o. female presenting for evaluation of a migraine.  Patient states she has a history of migraines.  Yesterday, she developed a right-sided headache which is sharp and throbbing.  She states it feels like her normal migraines.  She has associated photophobia and nausea without vomiting.  She denies vision changes, slurred speech, numbness, or weakness.  She denies trauma or injury.  She states she has been having worsening nasal congestion over the past several months, not improved with Flonase or zyrtec.  She denies fevers, chills, ear pain, sore throat, cough, chest pain, shortness of breath, nausea, vomiting, abdominal pain, urinary symptoms.  She takes Topamax daily for migraine prevention, and ibuprofen as needed for pain.  This is not improved her headache.  Nothing has made it better.  HPI  Past Medical History:  Diagnosis Date  . Asthma   . Migraine   . Seizures [facility])     Patient Active Problem List   Diagnosis Date Noted  . Chronic sinusitis 04/18/2017  . Migraine 04/18/2017  . Depression 03/14/2017  . Vaginal pain 03/13/2017  . Obesity (BMI 30-39.9) 01/23/2017  . Asthma 09/28/2016  . Allergic rhinitis 09/28/2016  . Tobacco abuse counseling 09/28/2016  . Uses contraceptive implants as primary birth control method 09/28/2016    Past Surgical History:  Procedure Laterality Date  . FRACTURE SURGERY      OB History    No data available       Home Medications    Prior to Admission medications   Medication Sig Start Date End Date Taking? Authorizing Provider  albuterol (PROVENTIL HFA;VENTOLIN HFA) 108 (90 Base) MCG/ACT inhaler Inhale 2 puffs into the lungs every 6 (six) hours as needed for wheezing. 09/28/16  Yes Molt, Bethany, DO    budesonide-formoterol (SYMBICORT) 160-4.5 MCG/ACT inhaler Inhale 2 puffs into the lungs every evening. 01/23/17  Yes Ahmed, Elyn Peers, MD  cetirizine (ZYRTEC) 10 MG tablet Take 1 tablet (10 mg total) by mouth daily. IM program. 07/17/17  Yes Molt, Bethany, DO  montelukast (SINGULAIR) 10 MG tablet Take 1 tablet (10 mg total) by mouth daily. 10/11/16 10/18/17 Yes Molt, Bethany, DO  topiramate (TOPAMAX) 25 MG tablet TAKE 1 TABLET BY MOUTH 2 TIMES DAILY. 08/09/17  Yes Molt, Bethany, DO  escitalopram (LEXAPRO) 10 MG tablet Take 1 tablet (10 mg total) by mouth daily. Patient not taking: Reported on 10/18/2017 03/13/17 03/13/18  Molt, Bethany, DO  fluticasone (FLONASE) 50 MCG/ACT nasal spray Place 2 sprays into both nostrils daily. 06/26/17   Darreld Mclean, MD  levocetirizine Elita Boone ALLERGY 24HR) 5 MG tablet Take 1 tablet (5 mg total) by mouth every evening. 10/19/17   Kyerra Vargo, PA-C    Family History Family History  Problem Relation Age of Onset  . Hypertension Mother   . Hypertension Other   . Diabetes Other     Social History Social History   Tobacco Use  . Smoking status: Never Smoker  . Smokeless tobacco: Never Used  . Tobacco comment: Smokes "Weed"  Substance Use Topics  . Alcohol use: No  . Drug use: Yes    Types: Marijuana     Allergies   Patient has no known allergies.   Review of Systems Review of Systems  HENT:  Positive for congestion.   Eyes: Positive for photophobia.  Gastrointestinal: Positive for nausea.  Neurological: Positive for headaches.  All other systems reviewed and are negative.    Physical Exam Updated Vital Signs BP 117/80 (BP Location: Left Arm)   Pulse 60   Temp 98.4 F (36.9 C) (Oral)   Resp 16   SpO2 100%   Physical Exam  Constitutional: She is oriented to person, place, and time. She appears well-developed and well-nourished. No distress.  HENT:  Head: Normocephalic and atraumatic.  Right Ear: Tympanic membrane, external ear and ear  canal normal.  Left Ear: Tympanic membrane, external ear and ear canal normal.  Nose: Mucosal edema present. Right sinus exhibits no maxillary sinus tenderness. Left sinus exhibits no maxillary sinus tenderness.  Mouth/Throat: Uvula is midline, oropharynx is clear and moist and mucous membranes are normal.  Eyes: Conjunctivae and EOM are normal. Pupils are equal, round, and reactive to light.  No nystagmus. EOMI and PERRLA  Neck: Normal range of motion.  Cardiovascular: Normal rate, regular rhythm and intact distal pulses.  Pulmonary/Chest: Effort normal and breath sounds normal. No respiratory distress. She has no wheezes.  Abdominal: Soft. Bowel sounds are normal. She exhibits no distension. There is no tenderness.  Musculoskeletal: Normal range of motion.  Strength intact x4.  Sensation intact x4.  Soft compartments.  Patient ambulatory.  Radial and pedal pulses equal bilaterally.  Neurological: She is alert and oriented to person, place, and time. She has normal strength. No cranial nerve deficit or sensory deficit. She displays a negative Romberg sign. Gait normal. GCS eye subscore is 4. GCS verbal subscore is 5. GCS motor subscore is 6.  Fine movement and coordination intact.   Skin: Skin is warm and dry.  Psychiatric: She has a normal mood and affect.  Nursing note and vitals reviewed.    ED Treatments / Results  Labs (all labs ordered are listed, but only abnormal results are displayed) Labs Reviewed - No data to display  EKG  EKG Interpretation None       Radiology No results found.  Procedures Procedures (including critical care time)  Medications Ordered in ED Medications  sodium chloride 0.9 % bolus 500 mL (500 mLs Intravenous New Bag/Given 10/18/17 2333)  ketorolac (TORADOL) 15 MG/ML injection 15 mg (15 mg Intravenous Given 10/18/17 2328)  metoCLOPramide (REGLAN) injection 10 mg (10 mg Intravenous Given 10/18/17 2327)  diphenhydrAMINE (BENADRYL) injection 25 mg  (25 mg Intravenous Given 10/18/17 2327)  dexamethasone (DECADRON) injection 10 mg (10 mg Intravenous Given 10/18/17 2328)     Initial Impression / Assessment and Plan / ED Course  I have reviewed the triage vital signs and the nursing notes.  Pertinent labs & imaging results that were available during my care of the patient were reviewed by me and considered in my medical decision making (see chart for details).     Patient presenting for evaluation of headache.  Physical exam shows no obvious neurologic deficits.  The patient appears nontoxic.  Will give headache cocktail and reassess.  Doubt CVA, injury, intracranial bleed, sinusitis, orbital cellulitis.  On reassessment, patient reports headache is improving.  Patient states she has been on Zyrtec daily for several years.  Will switch to different antihistamine and hopes this improves her nasal congestion.  Pt to f/u with PCP for further evalution and management of HAs and congestion. At this time, patient appears safe for discharge.  Return precautions given.  Patient states she understands and agrees to plan.  Final Clinical Impressions(s) / ED Diagnoses   Final diagnoses:  Acute nonintractable headache, unspecified headache type    ED Discharge Orders        Ordered    levocetirizine (XYZAL ALLERGY 24HR) 5 MG tablet  Every evening     10/19/17 0013       Alveria ApleyCaccavale, Kiyara Bouffard, PA-C 10/19/17 0025    Tegeler, Canary Brimhristopher J, MD 10/19/17 [phone]

## 2017-10-19 NOTE — ED Notes (Signed)
Pt states she needs something to eat before getting her medication, when given her options she became upset and states she doesn't want any of our shi- that it is all bland and nasty just give her the medication

## 2017-10-19 NOTE — Discharge Instructions (Signed)
Continue taking all your at home medications as prescribed. Make sure you are staying well-hydrated with water. Start using Xyzal daily instead of Zyrtec. Follow up with your primary care doctor for further evaluation and management of your headaches and congestion. To the emergency room if you develop vision changes, slurred speech, weakness, numbness, or any new or concerning symptoms.

## 2017-10-22 MED FILL — LEVOCETIRIZINE 5 MG TABLET: 5 | 30 days supply | Qty: 30 | Fill #0

## 2017-11-12 MED FILL — TOPIRAMATE 25 MG TAB: 25 | 30 days supply | Qty: 60 | Fill #2

## 2018-01-03 ENCOUNTER — Other Ambulatory Visit: Payer: Self-pay | Admitting: *Deleted

## 2018-01-03 ENCOUNTER — Telehealth: Payer: Self-pay | Admitting: Internal Medicine

## 2018-01-03 DIAGNOSIS — J4540 Moderate persistent asthma, uncomplicated: Secondary | ICD-10-CM

## 2018-01-03 MED ORDER — BUDESONIDE-FORMOTEROL FUMARATE 160-4.5 MCG/ACT IN AERO: 2.0000 | INHALATION_SPRAY | Freq: Every evening | RESPIRATORY_TRACT | 2 refills | Status: DC

## 2018-01-03 MED FILL — SYMBICORT 160-4.5 MCG INH: 160-4.5 | 60 days supply | Qty: 10 | Fill #0

## 2018-01-03 NOTE — Telephone Encounter (Signed)
Called pt - no answer; left message Symbicort rx was  Sent to [facility] pharmacy and call back to schedule an appt.

## 2018-01-03 NOTE — Telephone Encounter (Signed)
Per dr Heide Spark will need future appt with pcp

## 2018-01-03 NOTE — Telephone Encounter (Signed)
Patient calling to make sure medicine was sent to pharmacy

## 2018-01-08 NOTE — Telephone Encounter (Signed)
Appt with PCP 01/22/2018. L. Crescent Gotham, RN, BSN   

## 2018-01-22 ENCOUNTER — Encounter: Payer: Self-pay | Admitting: Internal Medicine

## 2018-01-22 ENCOUNTER — Other Ambulatory Visit: Payer: Self-pay

## 2018-01-22 ENCOUNTER — Ambulatory Visit (INDEPENDENT_AMBULATORY_CARE_PROVIDER_SITE_OTHER): Payer: Self-pay | Admitting: Internal Medicine

## 2018-01-22 VITALS — BP 105/68 | HR 54 | Temp 99.0°F | Ht 59.0 in | Wt 151.8 lb

## 2018-01-22 DIAGNOSIS — Z79899 Other long term (current) drug therapy: Secondary | ICD-10-CM

## 2018-01-22 DIAGNOSIS — G43109 Migraine with aura, not intractable, without status migrainosus: Secondary | ICD-10-CM

## 2018-01-22 DIAGNOSIS — Z7951 Long term (current) use of inhaled steroids: Secondary | ICD-10-CM

## 2018-01-22 DIAGNOSIS — Z6830 Body mass index (BMI) 30.0-30.9, adult: Secondary | ICD-10-CM

## 2018-01-22 DIAGNOSIS — E669 Obesity, unspecified: Secondary | ICD-10-CM

## 2018-01-22 DIAGNOSIS — J4540 Moderate persistent asthma, uncomplicated: Secondary | ICD-10-CM

## 2018-01-22 DIAGNOSIS — F320 Major depressive disorder, single episode, mild: Secondary | ICD-10-CM

## 2018-01-22 DIAGNOSIS — F329 Major depressive disorder, single episode, unspecified: Secondary | ICD-10-CM

## 2018-01-22 MED ORDER — ESCITALOPRAM OXALATE 10 MG PO TABS: 10.0000 mg | ORAL_TABLET | Freq: Every day | ORAL | 3 refills | Status: DC

## 2018-01-22 MED ORDER — MONTELUKAST SODIUM 10 MG PO TABS: 10.0000 mg | ORAL_TABLET | Freq: Every day | ORAL | 3 refills | Status: DC

## 2018-01-22 MED ORDER — TOPIRAMATE 25 MG PO TABS: 25.0000 mg | ORAL_TABLET | Freq: Two times a day (BID) | ORAL | 3 refills | Status: DC

## 2018-01-22 MED FILL — TOPIRAMATE 25 MG TAB: 25 | 30 days supply | Qty: 60 | Fill #0

## 2018-01-22 MED FILL — ESCITALOPRAM 10 MG TABLET: 10 | 30 days supply | Qty: 30 | Fill #0

## 2018-01-22 MED FILL — MONTELUKAST SOD 10 MG TAB: 10 | 30 days supply | Qty: 30 | Fill #0

## 2018-01-22 NOTE — Progress Notes (Signed)
   CC: follow-up of asthma, migraines  HPI:  Shelby Pittman is a 22 y.o. F with medical history as outlined below who presents today for medication refills. She has no acute complaints.   For details regarding today's visit and the status of their chronic medical issues, please refer to the assessment and plan.  Past Medical History:  Diagnosis Date  . Asthma   . Migraine    Expired Nexplanon    Review of Systems:   General: +Intentional weight loss (exercise). Denies fevers, chills, fatigue HEENT: Denies headache, acute changes in vision Cardiac: Denies CP, SOB Pulmonary: Denies cough, wheezing Abd: Denies abdominal pain, changes in bowels Extremities: Denies weakness or swelling  Physical Exam: General: Alert, in no acute distress. Pleasant and conversant. Appears well. HEENT: No icterus, injection or ptosis. No hoarseness or dysarthria  Cardiac: RRR, no MGR appreciated Pulmonary: CTA BL with normal WOB on RA. Able to speak in complete sentences Abd: Soft, non-tender. +bs Extremities: Warm, perfused. No pedal edema.   Vitals:   01/22/18 1331  BP: 105/68  Pulse: (!) 54  Temp: 99 F (37.2 C)  TempSrc: Oral  SpO2: 100%  Weight: 151 lb 12.8 oz (68.9 kg)  Height:  (1.499 m)   Body mass index is 30.66 kg/m.  Assessment & Plan:   See Encounters Tab for problem based charting.  Patient discussed with Dr. Cleda Daub

## 2018-01-22 NOTE — Assessment & Plan Note (Addendum)
Assessment: Asthma well controlled on current regimen of Symbicort QHS, prn albuterol, Singulair daily and Zyrtec. Takes Flonase prn and hasnt required prn albuterol in several weeks.   Plan: Continue current regimen. Refills sent to pharmacy.

## 2018-01-22 NOTE — Patient Instructions (Addendum)
It was nice seeing you today. Thank you for choosing [facility] for your Primary Care.   Today we talked about:  1) Asthma. Please continue taking your medications as you are. I've sent in refills of your Singulair to your pharmacy. Please continue taking Zyrtec.  2) Depression. Please continue taking Lexapro  daily. I've sent a refill to your pharmacy.  3) Migraines. I've sent in a refill to your pharmacy.   FOLLOW-UP INSTRUCTIONS When: 1 year For: asthma, migraine, depression What to bring: medications  Please contact the clinic if you have any problems, or need to be seen sooner.

## 2018-01-22 NOTE — Assessment & Plan Note (Addendum)
Assessment & Plan: Patient feeling well on Lexapro  daily. No complaints and feels her symptoms are well controlled. Refill sent to pharmacy.

## 2018-01-22 NOTE — Assessment & Plan Note (Signed)
Assessment & Plan: Down a few lbs since our last visit, BMI today 30%. Has increased her exercise regimen and thinks her improved mood is helping as well.

## 2018-01-22 NOTE — Assessment & Plan Note (Signed)
Assessment: Migraines well controled on Topamax  BID and denies any further episodes since her ED visit in Feb. Nexplanon expired, but patient not sexually active.  Plan: Refills sent of Topamax to pharmacy. Discussed use of barrier contraceptives.

## 2018-01-24 NOTE — Progress Notes (Signed)
Internal Medicine Clinic Attending  Case discussed with Dr. Molt at the time of the visit.  We reviewed the resident's history and exam and pertinent patient test results.  I agree with the assessment, diagnosis, and plan of care documented in the resident's note. 

## 2018-01-30 ENCOUNTER — Encounter: Payer: Self-pay | Admitting: Internal Medicine

## 2018-01-30 ENCOUNTER — Ambulatory Visit: Payer: Self-pay

## 2018-03-26 ENCOUNTER — Other Ambulatory Visit: Payer: Self-pay | Admitting: Internal Medicine

## 2018-03-26 DIAGNOSIS — J4520 Mild intermittent asthma, uncomplicated: Secondary | ICD-10-CM

## 2018-03-26 MED FILL — SYMBICORT 160-4.5 MCG INH: 160-4.5 | 30 days supply | Qty: 10 | Fill #1

## 2018-03-28 MED FILL — VENTOLIN HFA 90 MCG INHALER: 108 (90 BAS | 25 days supply | Qty: 18 | Fill #0

## 2018-04-03 ENCOUNTER — Encounter (INDEPENDENT_AMBULATORY_CARE_PROVIDER_SITE_OTHER): Payer: Self-pay

## 2018-04-03 ENCOUNTER — Encounter: Payer: Self-pay | Admitting: Internal Medicine

## 2018-04-03 ENCOUNTER — Ambulatory Visit (INDEPENDENT_AMBULATORY_CARE_PROVIDER_SITE_OTHER): Payer: Self-pay | Admitting: Internal Medicine

## 2018-04-03 DIAGNOSIS — B309 Viral conjunctivitis, unspecified: Secondary | ICD-10-CM

## 2018-04-03 DIAGNOSIS — H109 Unspecified conjunctivitis: Secondary | ICD-10-CM | POA: Insufficient documentation

## 2018-04-03 NOTE — Patient Instructions (Signed)
It was a pleasure to see you today [name]. Your pink eye is likely due to allergies. Please use cold compresses and artificial tears. Please return to clinic if your symptoms worsen.   If you have any questions or concerns, please call our clinic at [phone] between 9am-5pm and after hours call [phone] and ask for the internal medicine resident on call. If you feel you are having a medical emergency please call 911.   Thank you, we look forward to help you remain healthy!  Lorenso CourierVahini Destini Cambre, MD Internal Medicine PGY2     Allergic Conjunctivitis A clear membrane (conjunctiva) covers the white part of your eye and the inner surface of your eyelid. Allergic conjunctivitis happens when this membrane has inflammation. This is caused by allergies. Common causes of allergic reactions (allergens)include:  Outdoor allergens, such as: ? Pollen. ? Grass and weeds. ? Mold spores.  Indoor allergens, such as: ? Dust. ? Smoke. ? Mold. ? Pet dander. ? Animal hair.  This condition can make your eye red or pink. It can also make your eye feel itchy. This condition cannot be spread from one person to another person (is not contagious). Follow these instructions at home:  Try not to be around things that you are allergic to.  Take or apply over-the-counter and prescription medicines only as told by your doctor. These include any eye drops.  Place a cool, clean washcloth on your eye for 10-20 minutes. Do this 3-4 times a day.  Do not touch or rub your eyes.  Do not wear contact lenses until the inflammation is gone. Wear glasses instead.  Do not wear eye makeup until the inflammation is gone.  Keep all follow-up visits as told by your doctor. This is important. Contact a doctor if:  Your symptoms get worse.  Your symptoms do not get better with treatment.  You have mild eye pain.  You are sensitive to light,  You have spots or blisters on your eyes.  You have pus coming  from your eye.  You have a fever. Get help right away if:  You have redness, swelling, or other symptoms in only one eye.  Your vision is blurry.  You have vision changes.  You have very bad eye pain. Summary  Allergic conjunctivitis is caused by allergies. It can make your eye red or pink, and it can make your eye feel itchy.  This condition cannot be spread from one person to another person (is not contagious).  Try not to be around things that you are allergic to.  Take or apply over-the-counter and prescription medicines only as told by your doctor. These include any eye drops.  Contact your doctor if your symptoms get worse or they do not get better with treatment. This information is not intended to replace advice given to you by your health care provider. Make sure you discuss any questions you have with your health care provider. Document Released: 02/01/2010 Document Revised: 04/07/2016 Document Reviewed: 04/07/2016 Elsevier Interactive Patient Education  2017 [employer]

## 2018-04-03 NOTE — Progress Notes (Signed)
Internal Medicine Clinic Attending  Case discussed with Dr. Chundi at the time of the visit.  We reviewed the resident's history and exam and pertinent patient test results.  I agree with the assessment, diagnosis, and plan of care documented in the resident's note. 

## 2018-04-03 NOTE — Assessment & Plan Note (Signed)
Patient presents with a 2-day history of bilateral eye redness.  The patient states that she had accompanied by soreness, eye itching, crusting in the corner of her eyes in the morning.  Patient denies any discharge, sneezing, cough, chest pain.  She does not wear any contacts.  She has used artificial tears which she states has not really helped her.  He states that she noticed that her red eye started clearing up and then subsequently her left eye also cleared up without any redness.  She does have history of seasonal allergies.    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  Her bilateral eyes do not show any redness today during visit.  There is also not any discharge or crusting in the eyes and her extraocular muscles are intact.  -Recommended patient to continue artificial tears -Consider Visine/antihistamine eyedrops if the eye redness worsens -Use cold compresses

## 2018-04-03 NOTE — Progress Notes (Signed)
   CC: Conjunctivitis  HPI:  [name] is a 22 y.o. Female with asthma, migraines, chronic sinusitis who presents for pinkeye. Please see problem based charting for evaluation, assessment, and plan.   Past Medical History:  Diagnosis Date  . Asthma   . Migraine    Review of Systems:   Eye soreness, eye itching, crusting around eyes in the morning, shortness of breath Denies discharge from eye, sneezing, cough, chest pain  Physical Exam:  Vitals:   04/03/18 1014  BP: (!) 106/55  Pulse: (!) 53  Temp: 98.2 F (36.8 C)  TempSrc: Oral  SpO2: 100%  Weight: 155 lb 3.2 oz (70.4 kg)   Physical Exam  Constitutional: She appears well-developed and well-nourished. No distress.  HENT:  Head: Normocephalic and atraumatic.  Eyes: Pupils are equal, round, and reactive to light. Conjunctivae and EOM are normal. Right eye exhibits no chemosis and no discharge. Left eye exhibits no chemosis and no discharge. No scleral icterus.  No cobblestoning noted in bilateral eyes  Cardiovascular: Normal rate, regular rhythm and normal heart sounds.  Respiratory: Effort normal and breath sounds normal. No respiratory distress. She has no wheezes.  GI: Soft. Bowel sounds are normal. She exhibits no distension. There is no tenderness.  Neurological: She is alert.  Skin: She is not diaphoretic. No erythema.  Psychiatric: She has a normal mood and affect. Her behavior is normal. Judgment and thought content normal.     Assessment & Plan:   See Encounters Tab for problem based charting.   Conjunctivitis Patient presents with a 2-day history of bilateral eye redness.  The patient states that she had accompanied by soreness, eye itching, crusting in the corner of her eyes in the morning.  Patient denies any discharge, sneezing, cough, chest pain.  She does not wear any contacts.  She has used artificial tears which she states has not really helped her.  He states that she noticed that her red  eye started clearing up and then subsequently her left eye also cleared up without any redness.  She does have history of seasonal allergies.    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  Her bilateral eyes do not show any redness today during visit.  There is also not any discharge or crusting in the eyes and her extraocular muscles are intact.  -Recommended patient to continue artificial tears -Consider Visine/antihistamine eyedrops if the eye redness worsens -Use cold compresses  Patient discussed with Shelby Pittman

## 2018-04-23 ENCOUNTER — Ambulatory Visit: Payer: Self-pay

## 2018-04-23 ENCOUNTER — Encounter: Payer: Self-pay | Admitting: Internal Medicine

## 2018-05-19 ENCOUNTER — Encounter (HOSPITAL_BASED_OUTPATIENT_CLINIC_OR_DEPARTMENT_OTHER): Payer: Self-pay | Admitting: *Deleted

## 2018-05-19 ENCOUNTER — Other Ambulatory Visit: Payer: Self-pay

## 2018-05-19 ENCOUNTER — Emergency Department (HOSPITAL_BASED_OUTPATIENT_CLINIC_OR_DEPARTMENT_OTHER)
Admission: EM | Admit: 2018-05-19 | Discharge: 2018-05-19 | Disposition: A | Discharge status: Home / Self Care | Payer: Self-pay | Attending: Emergency Medicine | Admitting: Emergency Medicine

## 2018-05-19 DIAGNOSIS — R1013 Epigastric pain: Secondary | ICD-10-CM | POA: Insufficient documentation

## 2018-05-19 DIAGNOSIS — R112 Nausea with vomiting, unspecified: Secondary | ICD-10-CM | POA: Insufficient documentation

## 2018-05-19 DIAGNOSIS — J45909 Unspecified asthma, uncomplicated: Secondary | ICD-10-CM | POA: Insufficient documentation

## 2018-05-19 DIAGNOSIS — Z79899 Other long term (current) drug therapy: Secondary | ICD-10-CM | POA: Insufficient documentation

## 2018-05-19 DIAGNOSIS — R197 Diarrhea, unspecified: Secondary | ICD-10-CM | POA: Insufficient documentation

## 2018-05-19 LAB — CBC WITH DIFFERENTIAL/PLATELET
BASOS PCT: 0 %
Basophils Absolute: 0 10*3/uL (ref 0.0–0.1)
EOS ABS: 0 10*3/uL (ref 0.0–0.7)
Eosinophils Relative: 0 %
HCT: 44.2 % (ref 36.0–46.0)
Hemoglobin: 15.1 g/dL — ABNORMAL HIGH (ref 12.0–15.0)
LYMPHS ABS: 1.2 10*3/uL (ref 0.7–4.0)
Lymphocytes Relative: 16 %
MCH: 30.7 pg (ref 26.0–34.0)
MCHC: 34.2 g/dL (ref 30.0–36.0)
MCV: 89.8 fL (ref 78.0–100.0)
Monocytes Absolute: 0.3 10*3/uL (ref 0.1–1.0)
Monocytes Relative: 3 %
NEUTROS ABS: 6.1 10*3/uL (ref 1.7–7.7)
NEUTROS PCT: 81 %
Platelets: 255 10*3/uL (ref 150–400)
RBC: 4.92 MIL/uL (ref 3.87–5.11)
RDW: 12.6 % (ref 11.5–15.5)
WBC: 7.5 10*3/uL (ref 4.0–10.5)

## 2018-05-19 LAB — URINALYSIS, MICROSCOPIC (REFLEX)

## 2018-05-19 LAB — PREGNANCY, URINE: Preg Test, Ur: NEGATIVE

## 2018-05-19 LAB — URINALYSIS, ROUTINE W REFLEX MICROSCOPIC
BILIRUBIN URINE: NEGATIVE
Glucose, UA: NEGATIVE mg/dL
Hgb urine dipstick: NEGATIVE
KETONES UR: NEGATIVE mg/dL
LEUKOCYTES UA: NEGATIVE
NITRITE: NEGATIVE
Protein, ur: 100 mg/dL — AB
Specific Gravity, Urine: 1.015 (ref 1.005–1.030)
pH: >9 — ABNORMAL HIGH (ref 5.0–8.0)

## 2018-05-19 LAB — LIPASE, BLOOD: LIPASE: 22 U/L (ref 11–51)

## 2018-05-19 LAB — COMPREHENSIVE METABOLIC PANEL
ALBUMIN: 4.4 g/dL (ref 3.5–5.0)
ALT: 13 U/L (ref 0–44)
AST: 19 U/L (ref 15–41)
Alkaline Phosphatase: 31 U/L — ABNORMAL LOW (ref 38–126)
Anion gap: 13 (ref 5–15)
BUN: 11 mg/dL (ref 6–20)
CHLORIDE: 101 mmol/L (ref 98–111)
CO2: 26 mmol/L (ref 22–32)
Calcium: 9.4 mg/dL (ref 8.9–10.3)
Creatinine, Ser: 0.67 mg/dL (ref 0.44–1.00)
GFR calc Af Amer: >60 mL/min (ref >60)
Glucose, Bld: 99 mg/dL (ref 70–99)
POTASSIUM: 3.4 mmol/L — AB (ref 3.5–5.1)
SODIUM: 140 mmol/L (ref 135–145)
Total Bilirubin: 0.4 mg/dL (ref 0.3–1.2)
Total Protein: 8.1 g/dL (ref 6.5–8.1)

## 2018-05-19 MED ORDER — ONDANSETRON HCL 4 MG PO TABS: 4.0000 mg | ORAL_TABLET | Freq: Three times a day (TID) | ORAL | 0 refills | Status: DC | PRN

## 2018-05-19 MED ORDER — SODIUM CHLORIDE 0.9 % IV BOLUS
1000.0000 mL | Freq: Once | INTRAVENOUS | Status: AC
  Administered 2018-05-19: 1000 mL via INTRAVENOUS

## 2018-05-19 MED ORDER — ONDANSETRON HCL 4 MG/2ML IJ SOLN
4.0000 mg | Freq: Once | INTRAMUSCULAR | Status: AC
  Administered 2018-05-19: 4 mg via INTRAVENOUS
  Filled 2018-05-19: qty 2

## 2018-05-19 NOTE — ED Provider Notes (Signed)
[facility] EMERGENCY DEPARTMENT Provider Note   CSN: [id number] Arrival date & time: 05/19/18  1616     History   Chief Complaint Chief Complaint  Patient presents with  . Emesis    HPI Shelby Pittman is a 22 y.o. female resenting for evaluation of nausea, vomiting, diarrhea, and epigastric abdominal pain.  Patient states when she woke up this morning, she had epigastric abdominal pain.  She is had multiple episodes of diarrhea, and has been unable to tolerate p.o. without vomiting today.  She denies blood in her stool or emesis.  Patient states last night she had multiple alcoholic beverages, and ate spicy wings.  She had no symptoms last night.  She denies fevers, chills, chest pain, shortness of breath, lower abdominal pain, urinary symptoms.  She denies history of similar abdominal symptoms.  She has no history of abdominal surgeries.  She has a history of allergies and asthma, no other medical problems.  She has not taken anything for her symptoms.  She denies sick contacts.  She denies recent travel.  HPI  Past Medical History:  Diagnosis Date  . Asthma   . Migraine     Patient Active Problem List   Diagnosis Date Noted  . Conjunctivitis 04/03/2018  . Chronic sinusitis 04/18/2017  . Migraine 04/18/2017  . Depression 03/14/2017  . Vaginal pain 03/13/2017  . Obesity (BMI 30-39.9) 01/23/2017  . Asthma 09/28/2016  . Allergic rhinitis 09/28/2016  . Uses contraceptive implants as primary birth control method 09/28/2016    Past Surgical History:  Procedure Laterality Date  . FRACTURE SURGERY       OB History   None      Home Medications    Prior to Admission medications   Medication Sig Start Date End Date Taking? Authorizing Provider  budesonide-formoterol (SYMBICORT) 160-4.5 MCG/ACT inhaler Inhale 2 puffs into the lungs every evening. 01/03/18  Yes Earl Lagos, MD  cetirizine (ZYRTEC) 10 MG tablet Take 1 tablet (10 mg total) by mouth  daily. IM program. 07/17/17  Yes Molt, Bethany, DO  escitalopram (LEXAPRO) 10 MG tablet Take 1 tablet (10 mg total) by mouth daily. 01/22/18 01/22/19 Yes Molt, Bethany, DO  fluticasone (FLONASE) 50 MCG/ACT nasal spray Place 2 sprays into both nostrils daily. 06/26/17  Yes Darreld Mclean R, MD  montelukast (SINGULAIR) 10 MG tablet Take 1 tablet (10 mg total) by mouth daily. 01/22/18 01/22/19 Yes Molt, Bethany, DO  topiramate (TOPAMAX) 25 MG tablet Take 1 tablet (25 mg total) by mouth 2 (two) times daily. 01/22/18  Yes Molt, Bethany, DO  VENTOLIN HFA 108 (90 Base) MCG/ACT inhaler INHALE 2 PUFFS INTO THE LUNGS EVERY 6 (SIX) HOURS AS NEEDED FOR WHEEZING. 03/28/18  Yes Molt, Bethany, DO  ondansetron (ZOFRAN) 4 MG tablet Take 1 tablet (4 mg total) by mouth every 8 (eight) hours as needed for nausea or vomiting. 05/19/18   Jaquawn Saffran, PA-C    Family History Family History  Problem Relation Age of Onset  . Hypertension Mother   . Hypertension Other   . Diabetes Other     Social History Social History   Tobacco Use  . Smoking status: Never Smoker  . Smokeless tobacco: Never Used  . Tobacco comment: Smokes "Weed"  Substance Use Topics  . Alcohol use: No  . Drug use: Yes    Types: Marijuana     Allergies   Patient has no known allergies.   Review of Systems Review of Systems  Gastrointestinal: Positive for  abdominal pain (epigastric), nausea and vomiting.  All other systems reviewed and are negative.    Physical Exam Updated Vital Signs BP 117/61   Pulse 64   Temp 98.4 F (36.9 C) (Oral)   Resp 18   Ht 4\' 11"  (1.499 m)   Wt 70.3 kg   LMP 05/12/2018 (Approximate)   SpO2 100%   BMI 31.31 kg/m   Physical Exam  Constitutional: She is oriented to person, place, and time. She appears well-developed and well-nourished. No distress.  Appears in NAD  HENT:  Head: Normocephalic and atraumatic.  MM moist  Eyes: Pupils are equal, round, and reactive to light. Conjunctivae and EOM  are normal.  Neck: Normal range of motion. Neck supple.  Cardiovascular: Normal rate, regular rhythm and intact distal pulses.  Pulmonary/Chest: Effort normal and breath sounds normal. No respiratory distress. She has no wheezes.  Abdominal: Soft. She exhibits no distension and no mass. There is tenderness. There is no rebound and no guarding.  Mild tenderness palpation of epigastric abdomen.  No tenderness palpation of lower abdomen.  Negative Murphy's.  Negative rebound.  No signs of peritonitis  Musculoskeletal: Normal range of motion.  Neurological: She is alert and oriented to person, place, and time.  Skin: Skin is warm and dry. Capillary refill takes less than 2 seconds.  Psychiatric: She has a normal mood and affect.  Nursing note and vitals reviewed.    ED Treatments / Results  Labs (all labs ordered are listed, but only abnormal results are displayed) Labs Reviewed  CBC WITH DIFFERENTIAL/PLATELET - Abnormal; Notable for the following components:      Result Value   Hemoglobin 15.1 (*)    All other components within normal limits  COMPREHENSIVE METABOLIC PANEL - Abnormal; Notable for the following components:   Potassium 3.4 (*)    Alkaline Phosphatase 31 (*)    All other components within normal limits  URINALYSIS, ROUTINE W REFLEX MICROSCOPIC - Abnormal; Notable for the following components:   pH >9.0 (*)    Protein, ur 100 (*)    All other components within normal limits  URINALYSIS, MICROSCOPIC (REFLEX) - Abnormal; Notable for the following components:   Bacteria, UA MANY (*)    All other components within normal limits  LIPASE, BLOOD  PREGNANCY, URINE    EKG None  Radiology No results found.  Procedures Procedures (including critical care time)  Medications Ordered in ED Medications  sodium chloride 0.9 % bolus 1,000 mL (0 mLs Intravenous Stopped 05/19/18 1843)  ondansetron (ZOFRAN) injection 4 mg (4 mg Intravenous Given 05/19/18 1742)     Initial  Impression / Assessment and Plan / ED Course  I have reviewed the triage vital signs and the nursing notes.  Pertinent labs & imaging results that were available during my care of the patient were reviewed by me and considered in my medical decision making (see chart for details).     Pt presenting for evaluation of nausea, vomiting, diarrhea, and epigastric abdominal pain.  Physical exam reassuring, she is afebrile not tachycardic.  Appears nontoxic.  Symptoms began this morning, after she drank alcohol and ate a lot of spicy food last night.  Consider gastritis versus keratitis versus gastroenteritis versus cholecystitis.  Will obtain labs, urine, treat symptomatically, and reassess.  Labs reassuring, no leukocytosis.  Liver, pancreatic, kidney, and bili function reassuring.  Pregnancy negative.  Electrolytes stable.  Urine without infection.  On reassessment, patient reports she is feeling much better.  At this time,  doubt intra-abdominal infection, perforation, obstruction, or surgical abdomen.  I do not believe any further imaging is necessary.  Will try p.o. and reassess.  Patient tolerating ginger ale and crackers without difficulty.  No further vomiting or diarrhea.  Discussed findings with patient.  Discussed continued symptom medic treatment at home and care with diet over the next several days.  Patient to return with signs of worsening infection.  At this time, patient appears safe for discharge.  Return precautions given.  Patient states he understands and agrees to plan.  Final Clinical Impressions(s) / ED Diagnoses   Final diagnoses:  Nausea vomiting and diarrhea  Epigastric abdominal pain    ED Discharge Orders         Ordered    ondansetron (ZOFRAN) 4 MG tablet  Every 8 hours PRN     05/19/18 1950           Alveria ApleyCaccavale, Leiann Sporer, PA-C 05/19/18 2044    Arby BarrettePfeiffer, Marcy, MD 05/19/18 2330

## 2018-05-19 NOTE — ED Notes (Signed)
Pt given a ginger ale and saltine crackers for PO challenge

## 2018-05-19 NOTE — ED Triage Notes (Signed)
Pt reports drinking 3 drinks last night. C/o vomiting "non-stop" since 0900 this morning. Denies recreational drug use. States she is having chills

## 2018-05-19 NOTE — Discharge Instructions (Signed)
Use Zofran as needed for nausea or vomiting. It is very important that you are staying well-hydrated with water.  Your urine should be clear to pale yellow. Use Tylenol as needed for pain.  Avoid things such as ibuprofen or Advil, as this can worsen your stomach pain. Be careful with your diet over the next couple days.  Avoid things that are spicy, acidic, fatty, greasy.  There is information about diet in the paperwork. You may have continued symptoms in the next several days. Return to the emergency room if you are developing severe high fevers, persistent vomiting despite medication, severe/constant/worsening abdominal pain, or any new or concerning symptoms.

## 2018-06-03 MED FILL — SYMBICORT 160-4.5 MCG INH: 160-4.5 | 30 days supply | Qty: 10 | Fill #2

## 2018-06-17 ENCOUNTER — Encounter: Payer: Self-pay | Admitting: *Deleted

## 2018-07-03 ENCOUNTER — Other Ambulatory Visit: Payer: Self-pay | Admitting: Internal Medicine

## 2018-07-03 ENCOUNTER — Telehealth: Payer: Self-pay | Admitting: Internal Medicine

## 2018-07-03 DIAGNOSIS — J4540 Moderate persistent asthma, uncomplicated: Secondary | ICD-10-CM

## 2018-07-03 DIAGNOSIS — J4520 Mild intermittent asthma, uncomplicated: Secondary | ICD-10-CM

## 2018-07-03 NOTE — Telephone Encounter (Signed)
It is ok for her to increase her symbicort to two puffs twice a day, earlier in the day and in the evening. 4 puffs/day on her ventolin inhaler is also ok. As she is having to increase her inhaler use, I do think she should still come into the clinic to be seen to determine if she needs change in therapy.

## 2018-07-03 NOTE — Telephone Encounter (Signed)
Requesting to speak with a nurse about meds. Please call back.  

## 2018-07-03 NOTE — Telephone Encounter (Signed)
Returned call to patient.  Pt is requesting early refill on symbicort.   Pt states she is now having to use Symbicort 4-5 puffs / day, and Ventolin 4 puffs / day.  This nurse informed pt, if she is using her Symbicort more often, she may need to be seen for evaluation. Pt states she has another day's worth of Symbicort, pt on IM program.   Will route to PCP.  SChaplin, RN

## 2018-07-03 NOTE — Telephone Encounter (Signed)
Refill Request -  Pt requesting a call back as she has questions about her rescue Inhaler medication as well.   budesonide-formoterol (SYMBICORT) 160-4.5 MCG/ACT inhaler  [facility] - [place], [place] - [address]

## 2018-07-04 ENCOUNTER — Other Ambulatory Visit: Payer: Self-pay | Admitting: Internal Medicine

## 2018-07-04 MED ORDER — ALBUTEROL SULFATE HFA 108 (90 BASE) MCG/ACT IN AERS: 1.0000 | INHALATION_SPRAY | Freq: Four times a day (QID) | RESPIRATORY_TRACT | 2 refills | Status: DC | PRN

## 2018-07-04 MED ORDER — BUDESONIDE-FORMOTEROL FUMARATE 160-4.5 MCG/ACT IN AERO: 2.0000 | INHALATION_SPRAY | Freq: Two times a day (BID) | RESPIRATORY_TRACT | 2 refills | Status: DC

## 2018-07-04 MED FILL — VENTOLIN HFA 90 MCG INHALER: 108 (90 BAS | 25 days supply | Qty: 18 | Fill #0

## 2018-07-04 MED FILL — SYMBICORT 160-4.5 MCG INH: 160-4.5 | 30 days supply | Qty: 10 | Fill #0

## 2018-07-04 NOTE — Telephone Encounter (Signed)
Pt called and notified to increase her symbicort to 2 puffs BID, earlier in the day and in the evening.  Also informed 4 puffs on her ventolin inhaler/ day prn is ok.  Advised to make an appointment in clinic to determine if she needs a change in therapy per Dr. Cleaster Corin, offered appt, pt states she will call back to schedule.  Dr. Cleaster Corin, will you please send RX's for symbicort and ventolin to [facility] ou[facility] pharmacy w/ new Sig. Thank you! Garrison Michie

## 2018-07-04 NOTE — Telephone Encounter (Signed)
Refill Rx's

## 2018-07-11 ENCOUNTER — Ambulatory Visit: Payer: Self-pay

## 2018-07-12 ENCOUNTER — Ambulatory Visit (INDEPENDENT_AMBULATORY_CARE_PROVIDER_SITE_OTHER): Payer: Self-pay | Admitting: Internal Medicine

## 2018-07-12 ENCOUNTER — Encounter: Payer: Self-pay | Admitting: Internal Medicine

## 2018-07-12 ENCOUNTER — Other Ambulatory Visit: Payer: Self-pay

## 2018-07-12 DIAGNOSIS — J3089 Other allergic rhinitis: Secondary | ICD-10-CM

## 2018-07-12 DIAGNOSIS — J338 Other polyp of sinus: Secondary | ICD-10-CM

## 2018-07-12 DIAGNOSIS — Z8669 Personal history of other diseases of the nervous system and sense organs: Secondary | ICD-10-CM

## 2018-07-12 DIAGNOSIS — J309 Allergic rhinitis, unspecified: Secondary | ICD-10-CM

## 2018-07-12 NOTE — Assessment & Plan Note (Deleted)
She reports rhinorrhea and a sensation of ear fullness for the past year. She reports a history of bilateral ear infection towards the end of last year and has had problems with congestion being worse since them. She has had allergic rhinnitis from a young age, but never ear infections in youth. She denies sinus pressure, fevers, chills. Last week she began to have symptoms of an upper respiratory infection and this week she began having ear sensitivity to cold and loud music and a muffled sense of hearing. She manages her allergies with singulair and zyrtec. She has not been taking the flonase because she felt that it was not helping. Today she request ENT referral, this was done in the past but she felt that she could not afford the out of pocket expense and opted to focus on completing the orange card application first. She has not been able to complete the orange card application yet but is ready to pay out of pocket for the ENT evaluation now.  Exam is not consistent with ear infection this is likely related to sinus congestion with recent viral URI and there may be a component of eustachian tube dysfunction.  - continue cetirizine 10 mg and monteluklast  - counseled on the use of sinus irrigation prior to fluticasone to improve the efficacy of this - encouraged tobacco cessation  - referral placed to ENT  

## 2018-07-12 NOTE — Patient Instructions (Addendum)
Thank you for coming to the clinic today. It was a pleasure to see you.   For your ear pain - this is likely related to the cold that you had and your seasonal allergies, I think focusing on things that minimize your congestion will be the best way to improve your discomfort now and prevent you from having this in the future.   Try using sinus washes prior to your flonase and continue to use the zyrtec and singulair. Cutting back on smoking can make an impact on your allergies as well, let our office know if you would like to talk about this more.   FOLLOW-UP INSTRUCTIONS When: 1- 3 months with Dr. Cleaster CorinSeawell  For: follow up of your general health  What to bring: all of your medication bottles   Please call the internal medicine center clinic if you have any questions or concerns, we may be able to help and keep you from a long and expensive emergency room wait. Our clinic and after hours phone number is [phone], the best time to call is Monday through Friday 9 am to 4 pm but there is always someone available 24/7 if you have an emergency. If you need medication refills please notify your pharmacy one week in advance and they will send us a request.   BUFFERED ISOTONIC SALINE NASAL IRRIGATION  The Benefits:  1. When you irrigate, the isotonic saline (salt water) acts as a solvent and washes the mucus crusts and other debris from your nose.  2. This decongests and improves the airflow into your nose. The sinus passages begin to open.   Over the counter:  I recommend a Squirt Bottle form of saline to provide an adequate volume of irrigation to clear your sinuses.   Home Recipe:  1. Choose a 1-quart glass jar that is thoroughly cleansed.  2. Fill with sterile or distilled water, or you can boil water from the tap.  3. Add 1 to 2 heaping teaspoons of "pickling/canning/sea" salt (NOT table salt as it contains a large number of additives). This salt is available at the grocery store  in the food canning section.  4. Mix ingredients together and store at room temperature. Discard after one week. If you find this solution too strong, you may decrease the amount of salt added to 1 to 1  teaspoons. With children it is often best to start with a milder solution and advance slowly. Irrigate with 240 ml (8 oz) twice daily.  The Instructions:  You should plan to irrigate your nose with buffered isotonic saline 2 times per day. Many people prefer to warm the solution slightly in the microwave - but be sure that the solution is NOT HOT. Stand over the sink (some do this in the shower) and squirt the solution into each side of your nose, keeping your mouth open. This allows you to spit the saltwater out of your mouth. It will not harm you if you swallow a little.  If you have been told to use a nasal steroid such as Flonase, Nasonex, or Nasacort, you should always use isortonic saline solution first, then use your nasal steroid product. The nasal steroid is much more effective when sprayed onto clean nasal membranes and the steroid medicine will reach deeper into the nose.  Most people experience a little burning sensation the first few times they use a isotonic saline solution, but this usually goes away within a few day.

## 2018-07-12 NOTE — Progress Notes (Signed)
   CC: ear fullness and sensitivity  HPI:  [name] is a 22 y.o. with PMH as listed below who presents for ear fullness and sensitivity. Please see the assessment and plans for the status of the patient chronic medical problems.    Past Medical History:  Diagnosis Date  . Asthma   . Migraine    Review of Systems:  Refer to history of present illness and assessment and plans for pertinent review of systems, all others reviewed and negative  Physical Exam:  Vitals:   07/12/18 0943  BP: (!) 114/59  Pulse: 65  Temp: 98.2 F (36.8 C)  TempSrc: Oral  SpO2: 99%  Weight: 162 lb 11.2 oz (73.8 kg)  Height: 4\' 11"  (1.499 m)   General: well appearing, no acute distress  HEENT: nasal turbinates are boggy and erythematous, there is narrowing of the sinus canals, there is a polyp in the left nostryl, the tonsils are not visible, the oropharynx is without erythema or exudate, bilateral ear canals are patent, no signs of otitis externa, no effusion behind the tympanic membrane, no mastoid tenderness, no frontal or maxillary sinus tenderness  Eyes: normal sclera without icterus or erythema, normal conjunctiva  Cardiac: regular rate and rhythm, no murmurs  Pulm: normal work of breathing, lungs are clear to auscultation  Assessment & Plan:   Bilateral ear pain  She reports rhinorrhea and a sensation of ear fullness for the past year. She reports a history of bilateral ear infection towards the end of last year and has had problems with congestion being worse since them. She has had allergic rhinnitis from a young age, but never ear infections in youth. She denies sinus pressure, fevers, chills. Last week she began to have symptoms of an upper respiratory infection and this week she began having ear sensitivity to cold and loud music and a muffled sense of hearing. She manages her allergies with singulair and zyrtec. She has not been taking the flonase because she felt that it was not  helping. Today she request ENT referral, this was done in the past but she felt that she could not afford the out of pocket expense and opted to focus on completing the orange card application first. She has not been able to complete the orange card application yet but is ready to pay out of pocket for the ENT evaluation now.  Exam is not consistent with ear infection this is likely related to sinus congestion with recent viral URI and there may be a component of eustachian tube dysfunction.  - continue cetirizine 10 mg and monteluklast  - counseled on the use of sinus irrigation prior to fluticasone to improve the efficacy of this - encouraged tobacco cessation  - referral placed to ENT   Uninsured status -  Encouraged to schedule follow up with our front desk who can help her with the orange card application   See Encounters Tab for problem based charting.  Patient discussed with Dr. Criselda PeachesMullen

## 2018-07-12 NOTE — Assessment & Plan Note (Signed)
She reports rhinorrhea and a sensation of ear fullness for the past year. She reports a history of bilateral ear infection towards the end of last year and has had problems with congestion being worse since them. She has had allergic rhinnitis from a young age, but never ear infections in youth. She denies sinus pressure, fevers, chills. Last week she began to have symptoms of an upper respiratory infection and this week she began having ear sensitivity to cold and loud music and a muffled sense of hearing. She manages her allergies with singulair and zyrtec. She has not been taking the flonase because she felt that it was not helping. Today she request ENT referral, this was done in the past but she felt that she could not afford the out of pocket expense and opted to focus on completing the orange card application first. She has not been able to complete the orange card application yet but is ready to pay out of pocket for the ENT evaluation now.  Exam is not consistent with ear infection this is likely related to sinus congestion with recent viral URI and there may be a component of eustachian tube dysfunction.  - continue cetirizine 10 mg and monteluklast  - counseled on the use of sinus irrigation prior to fluticasone to improve the efficacy of this - encouraged tobacco cessation  - referral placed to ENT

## 2018-07-17 NOTE — Addendum Note (Signed)
Addended by: Debe CoderMULLEN, Jacque Garrels B on: 07/17/2018 09:01 PM   Modules accepted: Level of Service

## 2018-07-17 NOTE — Progress Notes (Signed)
Internal Medicine Clinic Attending  Case discussed with Dr. Blum at the time of the visit.  We reviewed the resident's history and exam and pertinent patient test results.  I agree with the assessment, diagnosis, and plan of care documented in the resident's note. 

## 2018-08-07 DIAGNOSIS — J343 Hypertrophy of nasal turbinates: Secondary | ICD-10-CM | POA: Insufficient documentation

## 2018-08-07 DIAGNOSIS — H9203 Otalgia, bilateral: Secondary | ICD-10-CM | POA: Insufficient documentation

## 2018-08-19 MED FILL — VENTOLIN HFA 90 MCG INHALER: 108 (90 BAS | 25 days supply | Qty: 18 | Fill #1

## 2018-08-19 MED FILL — SYMBICORT 160-4.5 MCG INH: 160-4.5 | 30 days supply | Qty: 10 | Fill #1

## 2018-08-20 ENCOUNTER — Encounter (HOSPITAL_BASED_OUTPATIENT_CLINIC_OR_DEPARTMENT_OTHER): Payer: Self-pay | Admitting: Emergency Medicine

## 2018-08-20 ENCOUNTER — Emergency Department (HOSPITAL_BASED_OUTPATIENT_CLINIC_OR_DEPARTMENT_OTHER)
Admission: EM | Admit: 2018-08-20 | Discharge: 2018-08-20 | Disposition: A | Discharge status: Home / Self Care | Payer: Self-pay | Attending: Emergency Medicine | Admitting: Emergency Medicine

## 2018-08-20 ENCOUNTER — Other Ambulatory Visit: Payer: Self-pay

## 2018-08-20 DIAGNOSIS — F329 Major depressive disorder, single episode, unspecified: Secondary | ICD-10-CM | POA: Insufficient documentation

## 2018-08-20 DIAGNOSIS — Z79899 Other long term (current) drug therapy: Secondary | ICD-10-CM | POA: Insufficient documentation

## 2018-08-20 DIAGNOSIS — J069 Acute upper respiratory infection, unspecified: Secondary | ICD-10-CM | POA: Insufficient documentation

## 2018-08-20 DIAGNOSIS — J45909 Unspecified asthma, uncomplicated: Secondary | ICD-10-CM | POA: Insufficient documentation

## 2018-08-20 DIAGNOSIS — F1290 Cannabis use, unspecified, uncomplicated: Secondary | ICD-10-CM | POA: Insufficient documentation

## 2018-08-20 DIAGNOSIS — J029 Acute pharyngitis, unspecified: Secondary | ICD-10-CM

## 2018-08-20 DIAGNOSIS — B9789 Other viral agents as the cause of diseases classified elsewhere: Secondary | ICD-10-CM

## 2018-08-20 LAB — GROUP A STREP BY PCR: GROUP A STREP BY PCR: NOT DETECTED

## 2018-08-20 MED ORDER — IBUPROFEN 600 MG PO TABS: 600.0000 mg | ORAL_TABLET | Freq: Four times a day (QID) | ORAL | 0 refills | Status: DC | PRN

## 2018-08-20 MED ORDER — SALINE SPRAY 0.65 % NA SOLN: 1.0000 | NASAL | 0 refills | Status: AC | PRN

## 2018-08-20 MED ORDER — FLUTICASONE PROPIONATE 50 MCG/ACT NA SUSP: 1.0000 | Freq: Every day | NASAL | 0 refills | Status: DC

## 2018-08-20 MED ORDER — DEXAMETHASONE 6 MG PO TABS
10.0000 mg | ORAL_TABLET | Freq: Once | ORAL | Status: AC
  Administered 2018-08-20: 10 mg via ORAL
  Filled 2018-08-20: qty 1

## 2018-08-20 MED ORDER — ACETAMINOPHEN 500 MG PO TABS: 500.0000 mg | ORAL_TABLET | Freq: Four times a day (QID) | ORAL | 0 refills | Status: DC | PRN

## 2018-08-20 MED ORDER — BENZONATATE 100 MG PO CAPS: 100.0000 mg | ORAL_CAPSULE | Freq: Three times a day (TID) | ORAL | 0 refills | Status: DC

## 2018-08-20 MED FILL — FLUTICASONE PROP 50 MCG SPR: 50 | 60 days supply | Qty: 16 | Fill #0

## 2018-08-20 MED FILL — BENZONATATE 100 MG CAPS: 100 | 7 days supply | Qty: 21 | Fill #0

## 2018-08-20 MED FILL — IBUPROFEN 600 MG TABLET: 600 | 7 days supply | Qty: 30 | Fill #0

## 2018-08-20 NOTE — Discharge Instructions (Addendum)
Take Tessalon every 8 hours as needed for cough.  Use Flonase once daily for nasal congestion.  You can use nasal saline as needed throughout the day for nasal congestion as well.  Alternate ibuprofen and Tylenol as prescribed for your throat pain and body aches.  Use your albuterol inhaler at home as needed.  Make sure to get plenty of rest and drink plenty of fluids.  Please return the emergency department if you develop any new or worsening symptoms.

## 2018-08-20 NOTE — ED Provider Notes (Signed)
[facility] EMERGENCY DEPARTMENT Provider Note   CSN: [id number] Arrival date & time: 08/20/18  1033     History   Chief Complaint Chief Complaint  Patient presents with  . Sore Throat    HPI Shelby Pittman is a 22 y.o. female with history of asthma, migraines, chronic sinusitis who presents with a 2-day history of sore throat, nasal congestion, cough.  Her cough is dry.  Her throat is her main concern.  She has been taking NyQuil and DayQuil at home without relief.  She denies any abdominal pain, nausea, vomiting.  She has had some chest pain only when she coughs.  She has had very mild shortness of breath relieved by her albuterol inhaler.  She denies any fevers at home.  HPI  Past Medical History:  Diagnosis Date  . Asthma   . Migraine     Patient Active Problem List   Diagnosis Date Noted  . Conjunctivitis 04/03/2018  . Chronic sinusitis 04/18/2017  . Migraine 04/18/2017  . Depression 03/14/2017  . Vaginal pain 03/13/2017  . Obesity (BMI 30-39.9) 01/23/2017  . Asthma 09/28/2016  . Allergic rhinitis 09/28/2016  . Uses contraceptive implants as primary birth control method 09/28/2016    Past Surgical History:  Procedure Laterality Date  . FRACTURE SURGERY       OB History   No obstetric history on file.      Home Medications    Prior to Admission medications   Medication Sig Start Date End Date Taking? Authorizing Provider  acetaminophen (TYLENOL) 500 MG tablet Take 1 tablet (500 mg total) by mouth every 6 (six) hours as needed. 08/20/18   Marielouise Amey, Waylan BogaAlexandra M, PA-C  albuterol (VENTOLIN HFA) 108 (90 Base) MCG/ACT inhaler Inhale 1-2 puffs into the lungs every 6 (six) hours as needed for wheezing or shortness of breath. 07/04/18   Seawell, Jaimie A, DO  benzonatate (TESSALON) 100 MG capsule Take 1 capsule (100 mg total) by mouth every 8 (eight) hours. 08/20/18   Tacara Hadlock, Waylan BogaAlexandra M, PA-C  budesonide-formoterol (SYMBICORT) 160-4.5 MCG/ACT inhaler Inhale  2 puffs into the lungs 2 (two) times daily. 07/04/18   Seawell, Jaimie A, DO  cetirizine (ZYRTEC) 10 MG tablet Take 1 tablet (10 mg total) by mouth daily. IM program. 07/17/17   Molt, Bethany, DO  escitalopram (LEXAPRO) 10 MG tablet Take 1 tablet (10 mg total) by mouth daily. 01/22/18 01/22/19  Molt, Bethany, DO  fluticasone (FLONASE) 50 MCG/ACT nasal spray Place 1 spray into both nostrils daily. 08/20/18   Natanael Saladin, Waylan BogaAlexandra M, PA-C  ibuprofen (ADVIL,MOTRIN) 600 MG tablet Take 1 tablet (600 mg total) by mouth every 6 (six) hours as needed. 08/20/18   Cherity Blickenstaff, Waylan BogaAlexandra M, PA-C  montelukast (SINGULAIR) 10 MG tablet Take 1 tablet (10 mg total) by mouth daily. 01/22/18 01/22/19  Molt, Bethany, DO  ondansetron (ZOFRAN) 4 MG tablet Take 1 tablet (4 mg total) by mouth every 8 (eight) hours as needed for nausea or vomiting. 05/19/18   Caccavale, Sophia, PA-C  sodium chloride (OCEAN) 0.65 % SOLN nasal spray Place 1 spray into both nostrils as needed for congestion. 08/20/18   Akasia Ahmad, Waylan BogaAlexandra M, PA-C  topiramate (TOPAMAX) 25 MG tablet Take 1 tablet (25 mg total) by mouth 2 (two) times daily. 01/22/18   Molt, Bethany, DO    Family History Family History  Problem Relation Age of Onset  . Hypertension Mother   . Hypertension Other   . Diabetes Other     Social History Social  History   Tobacco Use  . Smoking status: Current Some Day Smoker  . Smokeless tobacco: Never Used  . Tobacco comment: Smokes "Weed"  Substance Use Topics  . Alcohol use: No  . Drug use: Yes    Types: Marijuana     Allergies   Patient has no known allergies.   Review of Systems Review of Systems  Constitutional: Negative for chills and fever.  HENT: Positive for congestion and sore throat. Negative for ear pain and facial swelling.   Respiratory: Positive for cough and shortness of breath (mild).   Cardiovascular: Positive for chest pain (only with coughing).  Gastrointestinal: Negative for abdominal pain, nausea and vomiting.    Genitourinary: Negative for dysuria.  Musculoskeletal: Positive for myalgias. Negative for back pain.  Skin: Negative for rash and wound.  Neurological: Negative for headaches.  Psychiatric/Behavioral: The patient is not nervous/anxious.      Physical Exam Updated Vital Signs BP 111/77 (BP Location: Right Arm)   Pulse 76   Temp 99.5 F (37.5 C) (Oral)   Resp 18   Ht 4\' 11"  (1.499 m)   Wt 72.6 kg   LMP 08/13/2018   SpO2 96%   BMI 32.32 kg/m   Physical Exam Vitals signs and nursing note reviewed.  Constitutional:      General: She is not in acute distress.    Appearance: She is well-developed. She is not diaphoretic.  HENT:     Head: Normocephalic and atraumatic.     Right Ear: Tympanic membrane normal.     Left Ear: Tympanic membrane normal.     Mouth/Throat:     Pharynx: Posterior oropharyngeal erythema present. No pharyngeal swelling or oropharyngeal exudate.     Tonsils: No tonsillar exudate or tonsillar abscesses. Swelling: 1+ on the right. 1+ on the left.  Eyes:     General: No scleral icterus.       Right eye: No discharge.        Left eye: No discharge.     Conjunctiva/sclera: Conjunctivae normal.     Pupils: Pupils are equal, round, and reactive to light.  Neck:     Musculoskeletal: Normal range of motion and neck supple.     Thyroid: No thyromegaly.  Cardiovascular:     Rate and Rhythm: Normal rate and regular rhythm.     Heart sounds: Normal heart sounds. No murmur. No friction rub. No gallop.   Pulmonary:     Effort: Pulmonary effort is normal. No respiratory distress.     Breath sounds: Normal breath sounds. No stridor. No wheezing or rales.  Abdominal:     General: Bowel sounds are normal. There is no distension.     Palpations: Abdomen is soft.     Tenderness: There is no abdominal tenderness. There is no guarding or rebound.  Lymphadenopathy:     Cervical: No cervical adenopathy.  Skin:    General: Skin is warm and dry.     Coloration: Skin is  not pale.     Findings: No rash.  Neurological:     Mental Status: She is alert.     Coordination: Coordination normal.      ED Treatments / Results  Labs (all labs ordered are listed, but only abnormal results are displayed) Labs Reviewed  GROUP A STREP BY PCR    EKG None  Radiology No results found.  Procedures Procedures (including critical care time)  Medications Ordered in ED Medications  dexamethasone (DECADRON) tablet 10 mg (has no administration  in time range)     Initial Impression / Assessment and Plan / ED Course  I have reviewed the triage vital signs and the nursing notes.  Pertinent labs & imaging results that were available during my care of the patient were reviewed by me and considered in my medical decision making (see chart for details).     Pt with negative strep. Diagnosis of viral pharyngitis. No abx indicated at this time.  Lungs are clear.  No indication for chest x-ray at this time.  Discharge with symptomatic tx. No evidence of dehydration. Pt is tolerating secretions. Presentation not concerning for peritonsillar abscess or spread of infection to deep spaces of the throat; patent airway. Specific return precautions discussed. Recommended PCP follow up.  Patient understands and agrees with plan.  Patient vitals stable throughout ED course and discharged in satisfactory condition.   Final Clinical Impressions(s) / ED Diagnoses   Final diagnoses:  Sore throat  Viral URI with cough    ED Discharge Orders         Ordered    benzonatate (TESSALON) 100 MG capsule  Every 8 hours     08/20/18 1218    ibuprofen (ADVIL,MOTRIN) 600 MG tablet  Every 6 hours PRN     08/20/18 1218    acetaminophen (TYLENOL) 500 MG tablet  Every 6 hours PRN     08/20/18 1218    fluticasone (FLONASE) 50 MCG/ACT nasal spray  Daily     08/20/18 1218    sodium chloride (OCEAN) 0.65 % SOLN nasal spray  As needed     08/20/18 1218           Emi Holes,  PA-C 08/20/18 1220    Doug Sou, MD 08/20/18 [phone]

## 2018-08-20 NOTE — ED Triage Notes (Signed)
Pt presents with sore throat cough and runny nose since Saturday, and body aches

## 2018-09-18 MED FILL — SYMBICORT 160-4.5 MCG INH: 160-4.5 | 30 days supply | Qty: 10 | Fill #2

## 2018-09-18 MED FILL — VENTOLIN HFA 90 MCG INHALER: 108 (90 BAS | 25 days supply | Qty: 18 | Fill #2

## 2018-10-29 ENCOUNTER — Other Ambulatory Visit: Payer: Self-pay

## 2018-10-29 ENCOUNTER — Encounter: Payer: Self-pay | Admitting: Internal Medicine

## 2018-10-29 ENCOUNTER — Ambulatory Visit (INDEPENDENT_AMBULATORY_CARE_PROVIDER_SITE_OTHER): Payer: Self-pay | Admitting: Internal Medicine

## 2018-10-29 VITALS — BP 115/67 | HR 62 | Temp 99.0°F | Ht 59.0 in | Wt 161.5 lb

## 2018-10-29 DIAGNOSIS — Z79899 Other long term (current) drug therapy: Secondary | ICD-10-CM

## 2018-10-29 DIAGNOSIS — J45909 Unspecified asthma, uncomplicated: Secondary | ICD-10-CM

## 2018-10-29 DIAGNOSIS — E66811 Obesity, class 1: Secondary | ICD-10-CM

## 2018-10-29 DIAGNOSIS — G43109 Migraine with aura, not intractable, without status migrainosus: Secondary | ICD-10-CM

## 2018-10-29 DIAGNOSIS — E669 Obesity, unspecified: Secondary | ICD-10-CM | POA: Insufficient documentation

## 2018-10-29 DIAGNOSIS — Z6832 Body mass index (BMI) 32.0-32.9, adult: Secondary | ICD-10-CM

## 2018-10-29 DIAGNOSIS — F339 Major depressive disorder, recurrent, unspecified: Secondary | ICD-10-CM

## 2018-10-29 DIAGNOSIS — E66812 Obesity, class 2: Secondary | ICD-10-CM | POA: Insufficient documentation

## 2018-10-29 DIAGNOSIS — F320 Major depressive disorder, single episode, mild: Secondary | ICD-10-CM

## 2018-10-29 DIAGNOSIS — Z7951 Long term (current) use of inhaled steroids: Secondary | ICD-10-CM

## 2018-10-29 DIAGNOSIS — J4540 Moderate persistent asthma, uncomplicated: Secondary | ICD-10-CM

## 2018-10-29 DIAGNOSIS — J329 Chronic sinusitis, unspecified: Secondary | ICD-10-CM

## 2018-10-29 DIAGNOSIS — G43909 Migraine, unspecified, not intractable, without status migrainosus: Secondary | ICD-10-CM

## 2018-10-29 MED ORDER — TOPIRAMATE 25 MG PO TABS: 25.0000 mg | ORAL_TABLET | Freq: Two times a day (BID) | ORAL | 1 refills | Status: DC

## 2018-10-29 MED ORDER — CETIRIZINE HCL 10 MG PO TABS: 10.0000 mg | ORAL_TABLET | Freq: Every day | ORAL | 11 refills | Status: DC

## 2018-10-29 MED ORDER — BUDESONIDE-FORMOTEROL FUMARATE 160-4.5 MCG/ACT IN AERO: 2.0000 | INHALATION_SPRAY | Freq: Two times a day (BID) | RESPIRATORY_TRACT | 2 refills | Status: DC

## 2018-10-29 MED ORDER — MONTELUKAST SODIUM 10 MG PO TABS: 10.0000 mg | ORAL_TABLET | Freq: Every day | ORAL | 3 refills | Status: DC

## 2018-10-29 MED FILL — MONTELUKAST SOD 10 MG TAB: 10 | 30 days supply | Qty: 30 | Fill #0

## 2018-10-29 MED FILL — SYMBICORT 160-4.5 MCG INH: 160-4.5 | 30 days supply | Qty: 10 | Fill #0 | Status: TO

## 2018-10-29 MED FILL — TOPIRAMATE 25 MG TAB: 25 | 30 days supply | Qty: 60 | Fill #0

## 2018-10-29 NOTE — Progress Notes (Deleted)
   CC: Medication Check  HPI:  [name] is a 23 y.o. with PMH as below.   Please see A&P for assessment of the patient's acute and chronic medical conditions.    Past Medical History:  Diagnosis Date  . Asthma   . Migraine    Review of Systems:  ***  Physical Exam:  Constitution: HENT: Eyes: Cardio: Respiratory: Abdominal: MSK: GU: Neuro: Skin:   There were no vitals filed for this visit. ***  Assessment & Plan:   See Encounters Tab for problem based charting.  Patient {GC/GE:[id number]::"discussed with","seen with"} Dr. {NAMES:[id number]::"Butcher","Granfortuna","E. Hoffman","Klima","Mullen","Narendra","Raines","Vincent"}

## 2018-10-29 NOTE — Assessment & Plan Note (Signed)
  Patient states that she was having depressive symptoms due to her weight.  She was previously being prescribed Lexapro 10 mg daily that she took regularly till December 2019 when she ran out of medication and did not have any refills.  The patient currently has a PHQ 9 score of 1.  She denies difficulty with concentration, loss of interest in what she enjoys, decreased/excessive sleep, excessive weight loss or weight gain.  -Discontinued Lexapro as it has a side effect of weight gain -Offered integrated behavioral health services referral to patient but she stated that she did not need it at this time. -Commend continuing to monitor patient for signs of depression

## 2018-10-29 NOTE — Assessment & Plan Note (Signed)
Patient states that she currently has 1-2 headaches weekly that are worsened with bright lights and loud noises.  She states that she was headache free while on topiramate.  -Refilled topiramate 25 mg twice daily

## 2018-10-29 NOTE — Assessment & Plan Note (Signed)
Patient has been free of asthma exacerbations and has not needed to use albuterol inhaler in several months.   -Refilled Symbicort -Encouraged use of albuterol as needed -Recommend getting PFTs for official diagnosis of asthma after patient obtains insurance

## 2018-10-29 NOTE — Progress Notes (Signed)
   CC: Medication review  HPI:  Shelby Pittman is a 23 y.o. female with asthma, chronic sinusitis, migraine disorder who presents for medication review. Please see problem based charting for evaluation, assessment, and plan.  Past Medical History:  Diagnosis Date  . Asthma   . Migraine    Review of Systems:    Review of Systems  Constitutional: Negative for weight loss.  Eyes: Negative for blurred vision.  Gastrointestinal: Negative for abdominal pain, nausea and vomiting.  Genitourinary: Negative for dysuria.  Neurological: Positive for headaches. Negative for dizziness.   Physical Exam:  Vitals:   10/29/18 0918  BP: 115/67  Pulse: 62  Temp: 99 F (37.2 C)  SpO2: 97%  Weight: 161 lb 8 oz (73.3 kg)   Physical Exam  Constitutional: Appears well-developed and well-nourished. No distress.  HENT:  Head: Normocephalic and atraumatic.  Eyes: Conjunctivae are normal.  Cardiovascular: Normal rate, regular rhythm and normal heart sounds.  Respiratory: Effort normal and breath sounds normal. No respiratory distress. No wheezes.  GI: Soft. Bowel sounds are normal. No distension. There is no tenderness.  Musculoskeletal: No edema.  Neurological: Is alert.  Skin: Not diaphoretic. No erythema. 5cm area that is raised on right shin post trauma, non erythematous and nontender Psychiatric: Normal mood and affect. Behavior is normal. Judgment and thought content normal.    Assessment & Plan:   See Encounters Tab for problem based charting.  Patient discussed with Dr. Josem Kaufmann

## 2018-10-29 NOTE — Patient Instructions (Signed)
It was a pleasure to see you today [name]. I have refilled your medications during this visit, please make sure to pick them up. I have refilled Topiramate which should help decrease your migraine headaches and also help with weight loss. Please discontinue lexapro as it can cause some weight gain.  If you have any questions or concerns, please call our clinic at [phone] between 9am-5pm and after hours call [phone] and ask for the internal medicine resident on call. If you feel you are having a medical emergency please call 911.   Thank you, we look forward to help you remain healthy!  Lorenso Courier, MD Internal Medicine PGY2

## 2018-10-29 NOTE — Progress Notes (Signed)
Case discussed with Dr. Chundi at the time of the visit.  We reviewed the resident's history and exam and pertinent patient test results.  I agree with the assessment, diagnosis and plan of care documented in the resident's note. 

## 2018-10-29 NOTE — Assessment & Plan Note (Addendum)
  Patient  currently has a BMI of 32.  Her weight has remained stable in the upper 150s-low 160s over the past year.  Patient states that she eats 1 large meal at nighttime daily.  -Counseled patient on avoiding sodas, exercising regularly -Counseled on healthy foods and to not eat late at night -Continue topiramate 25 mg twice daily to assist with weight loss -Discontinue Lexapro as this has a side effect of weight gain

## 2018-11-08 ENCOUNTER — Encounter: Payer: Self-pay | Admitting: Internal Medicine

## 2018-11-13 ENCOUNTER — Ambulatory Visit: Payer: Self-pay

## 2018-11-28 IMAGING — CT CT HEAD W/O CM
3 of 4 series · 15 of 47 positions shown, 18 images · non-contrast
Comparison: None.

CLINICAL DATA: Headache for 1 week.  Blurred vision.

EXAM:
CT HEAD WITHOUT CONTRAST
TECHNIQUE: Contiguous axial images were obtained from the base of the skull
through the vertex without intravenous contrast.

[Series 2: head w/o · axial · non-contrast · 0.45mm/px · z∈[+1477,+1592]mm · 9 of 27 slices shown, 12 images]
[im 2/27  brain]
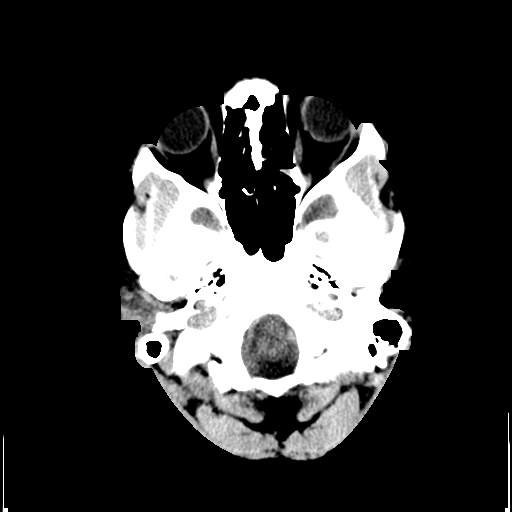
[im 2/27  bone]
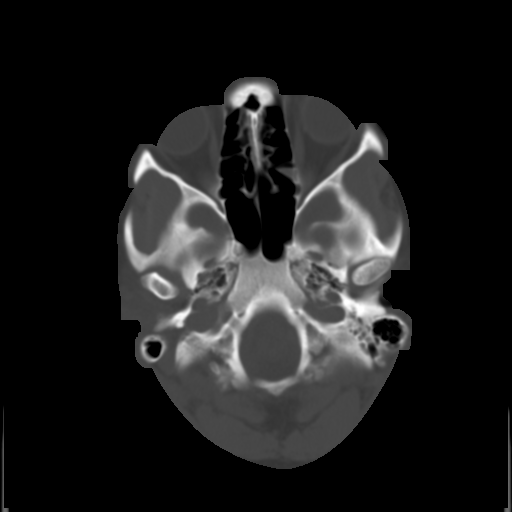
[im 6/27  brain]
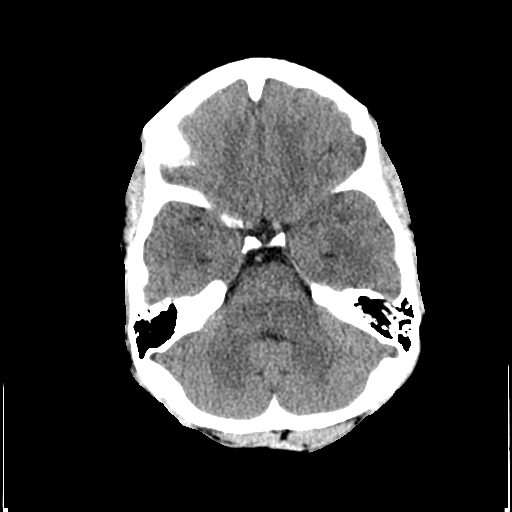
[im 8/27  brain]
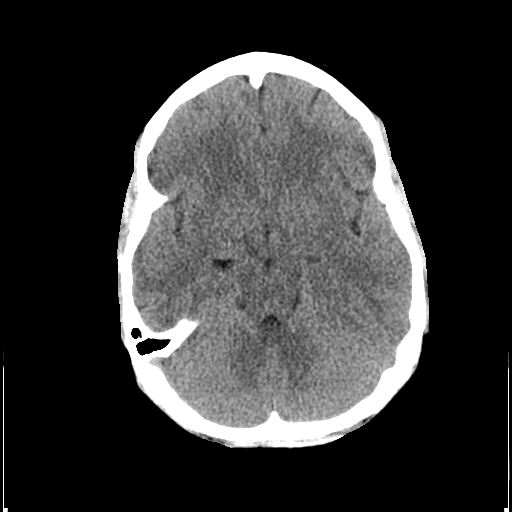
[im 12/27  brain]
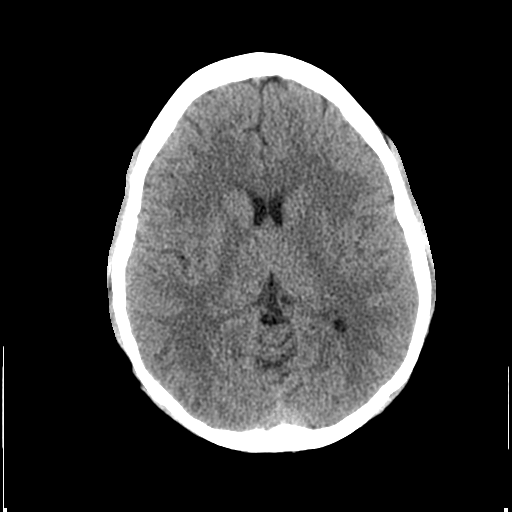
[im 14/27  brain]
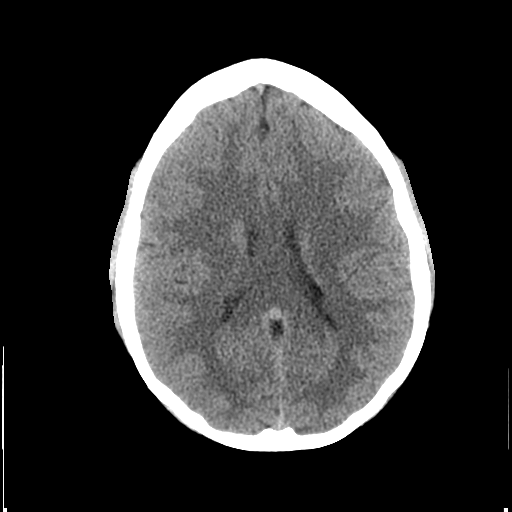
[im 14/27  bone]
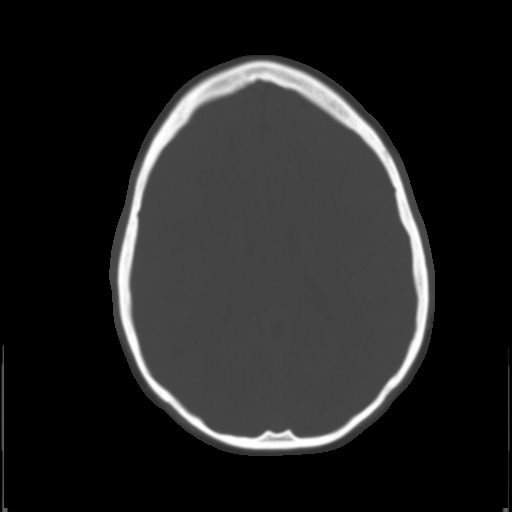
[im 15/27  brain]
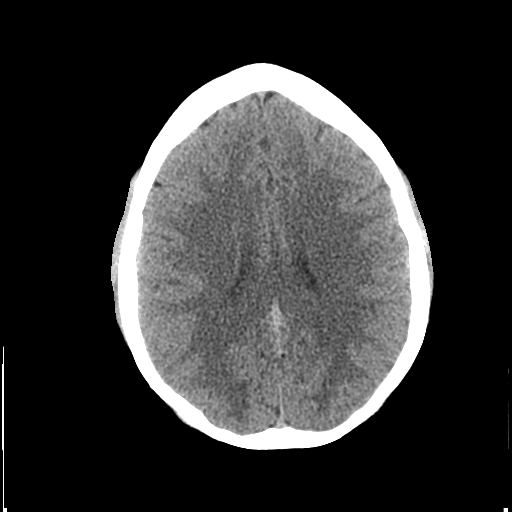
[im 19/27  brain]
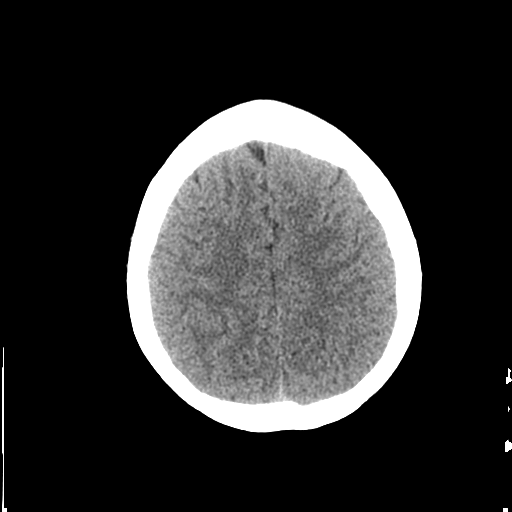
[im 21/27  brain]
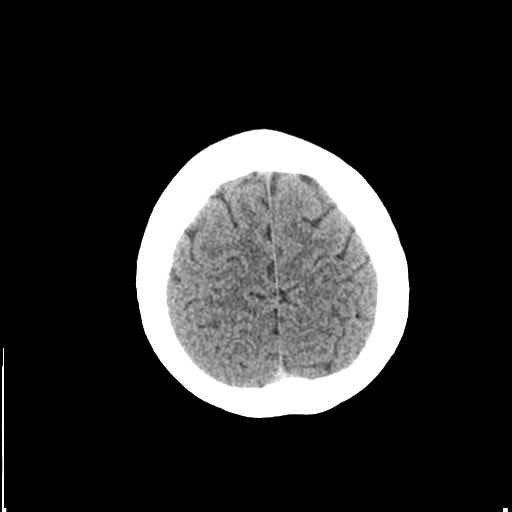
[im 25/27  brain]
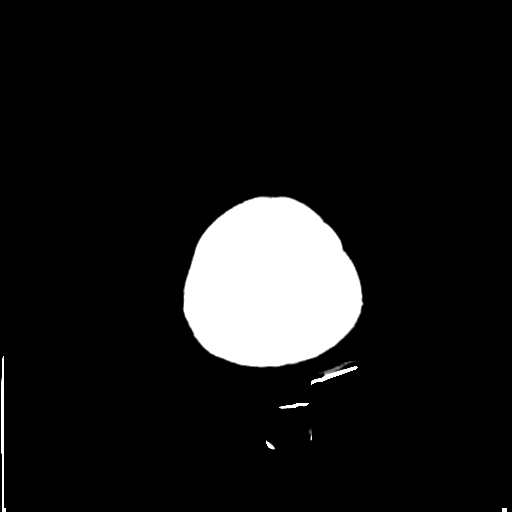
[im 25/27  bone]
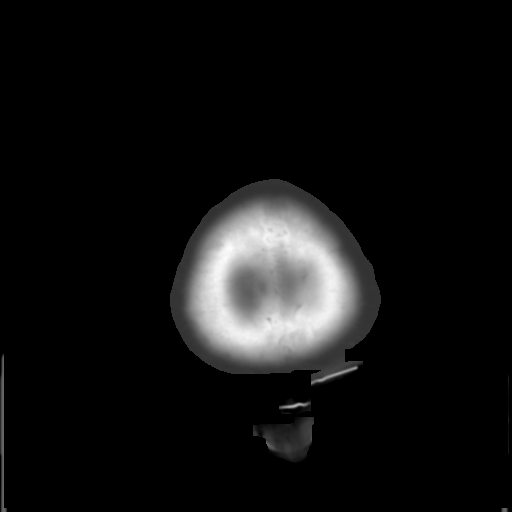

[Series 4: coronal · coronal · 0.26mm/px · 3 of 66 slices shown]
[im 22/66  brain]
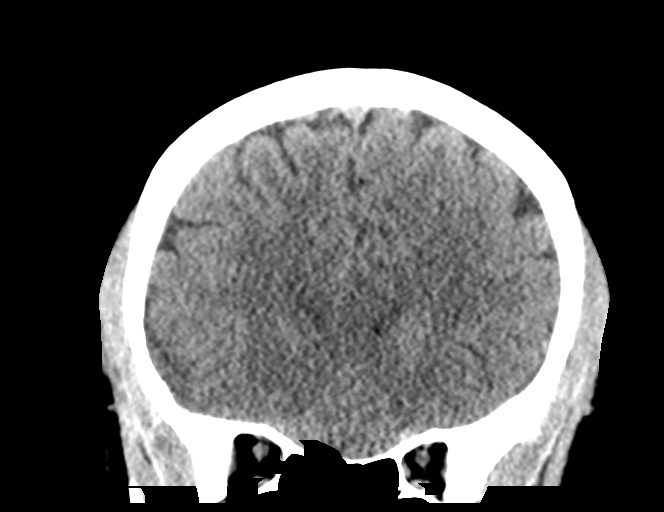
[im 29/66  brain]
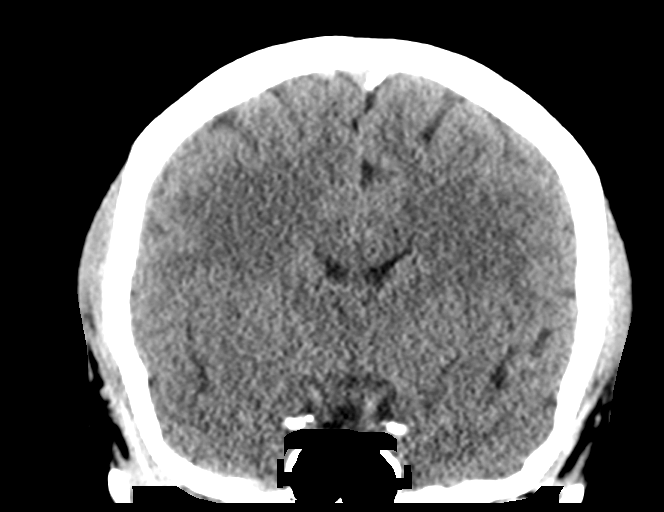
[im 37/66  brain]
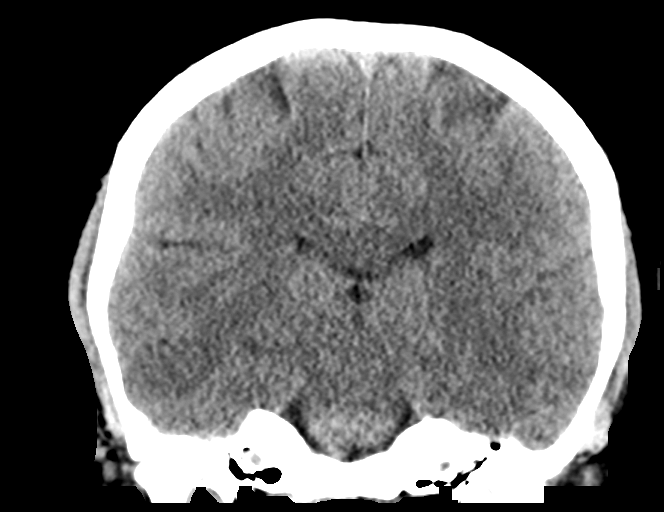

[Series 5: sagittal · sagittal · 0.26mm/px · 3 of 59 slices shown]
[im 20/59  brain]
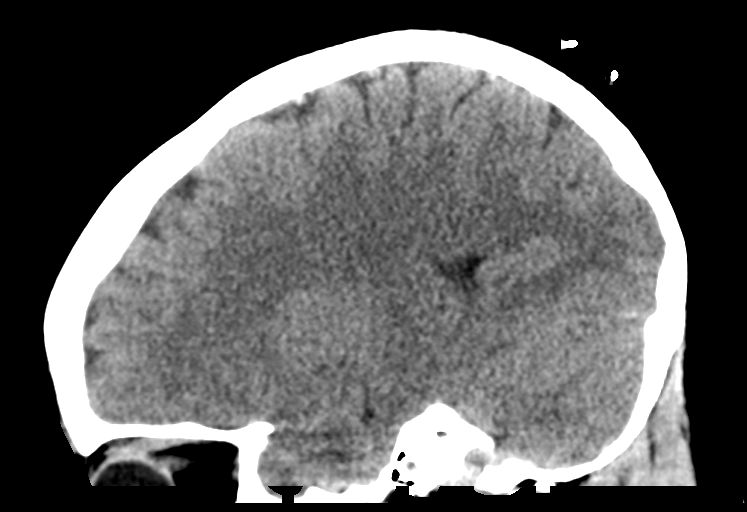
[im 30/59  brain]
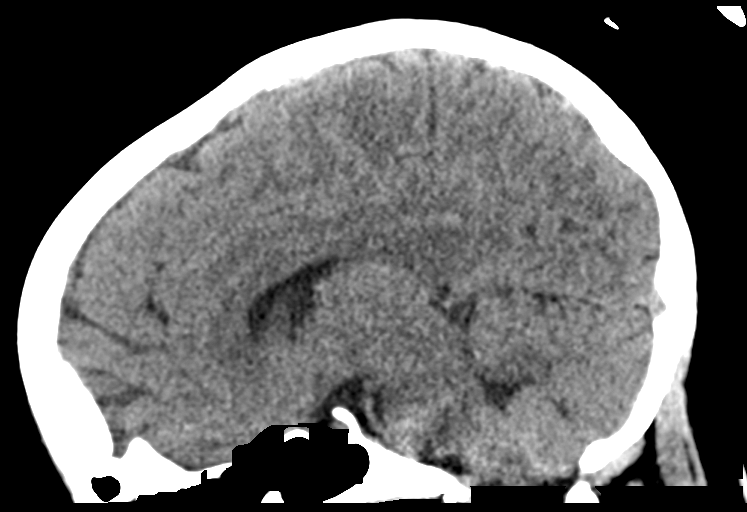
[im 39/59  brain]
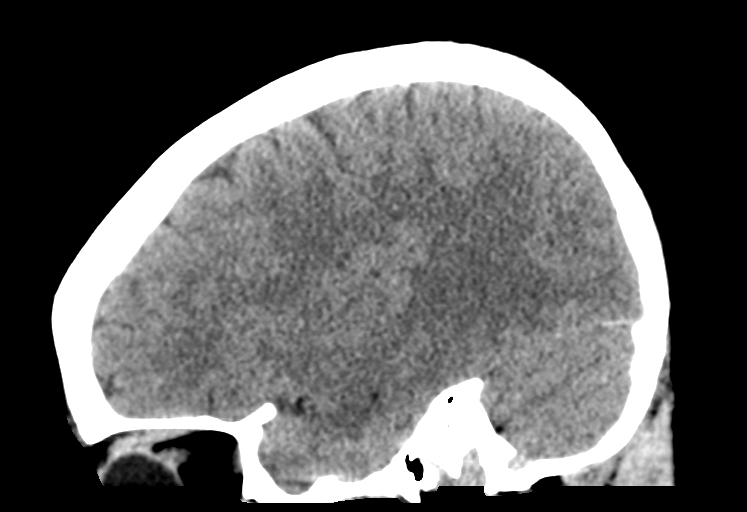

[15 of 47 positions shown; findings below may reference images not displayed]

FINDINGS: Brain: No evidence of acute infarction, hemorrhage, hydrocephalus,
extra-axial collection or mass lesion/mass effect.

Vascular: No hyperdense vessel or unexpected calcification.

Skull: Normal. Negative for fracture or focal lesion.

Sinuses/Orbits: No acute finding.

Other: None.
IMPRESSION: Normal brain.  No acute abnormalities.

## 2018-11-29 ENCOUNTER — Other Ambulatory Visit: Payer: Self-pay | Admitting: Internal Medicine

## 2018-11-29 DIAGNOSIS — J4520 Mild intermittent asthma, uncomplicated: Secondary | ICD-10-CM

## 2018-11-29 MED FILL — SYMBICORT 160-4.5 MCG INH: 160-4.5 | 30 days supply | Qty: 10 | Fill #0

## 2018-12-11 ENCOUNTER — Encounter: Payer: Self-pay | Admitting: Internal Medicine

## 2018-12-16 ENCOUNTER — Telehealth: Payer: Self-pay

## 2018-12-16 ENCOUNTER — Other Ambulatory Visit: Payer: Self-pay | Admitting: Oncology

## 2018-12-16 MED ORDER — FLUTICASONE PROPIONATE 50 MCG/ACT NA SUSP: 1.0000 | Freq: Every day | NASAL | 4 refills | Status: DC

## 2018-12-16 NOTE — Telephone Encounter (Signed)
I put in order for more flonase

## 2018-12-16 NOTE — Telephone Encounter (Signed)
She just got Rx for Zyrtec & ventolin on 4/6. What else does she need? More flonase? DrG

## 2018-12-16 NOTE — Telephone Encounter (Signed)
Stated Flonase isn't helping very much. She wanted to know if there's any thing else for her to try?

## 2018-12-16 NOTE — Telephone Encounter (Signed)
No. Continue her regular asthma meds and anti-histamines. Stay indoors to avoid pollen.

## 2018-12-16 NOTE — Telephone Encounter (Signed)
Requesting med for nasal congestion. Please call pt back.

## 2018-12-16 NOTE — Telephone Encounter (Signed)
Return pt's call - stated she has allergies, pollen making it worse,c/o nasal congestion. Stated she has tried Audiological scientist and has been using [employer] brand 12 hr nasal spray. She's blowing her nose constantly and starting to bleed. Please send rx to [facility]. Thanks

## 2018-12-17 NOTE — Telephone Encounter (Signed)
Called pt - informed pt to continue using her asthma meds and anti-histamines; stay inside to avoid pollen per Dr Cyndie Chime. Pt stated she has been doing this and nothing is working; and her nose is bleeding. [facility] tele health appt scheduled for tomorrow am.

## 2018-12-18 ENCOUNTER — Ambulatory Visit (INDEPENDENT_AMBULATORY_CARE_PROVIDER_SITE_OTHER): Payer: Self-pay | Admitting: Internal Medicine

## 2018-12-18 ENCOUNTER — Other Ambulatory Visit: Payer: Self-pay

## 2018-12-18 DIAGNOSIS — J3089 Other allergic rhinitis: Secondary | ICD-10-CM

## 2018-12-18 DIAGNOSIS — J4540 Moderate persistent asthma, uncomplicated: Secondary | ICD-10-CM

## 2018-12-18 MED ORDER — PREDNISONE 50 MG PO TABS: 50.0000 mg | ORAL_TABLET | Freq: Every day | ORAL | 0 refills | Status: DC

## 2018-12-18 MED ORDER — AZELASTINE HCL 137 MCG/SPRAY NA SOLN: 1.0000 | NASAL | 0 refills | Status: DC

## 2018-12-18 MED FILL — predniSONE 50 MG TABS: 50 | 5 days supply | Qty: 5 | Fill #0

## 2018-12-18 MED FILL — AZELASTINE HCL 137 MCG SPRY: 0.1 | 30 days supply | Qty: 30 | Fill #0

## 2018-12-18 NOTE — Assessment & Plan Note (Signed)
HPI:  Patient denies increased use of her rescue inhaler, cough, shortness of breath. She is not wheezing.  A/P: - Given her allergies we will do a short course of prednisone.

## 2018-12-18 NOTE — Assessment & Plan Note (Signed)
HPI:  Patient called and with 2 to 3 week history of nasal congestion, sneezing, rhinorrhea. She is currently on Flonase, using an over-the-counter saline nose spray, Singulair, and Zyrtec. She is not had much relief with these medications. She is wondering what else she can do. She has had no systemic signs of infection including fevers, chills, nausea/vomiting, myalgias, arthralgias, shortness of breath, cough. She does have a history of asthma.  A/P: - We discussed trying an intranasal antihistamine and prednisone therapy. She will call back if her symptoms are not improving.

## 2018-12-18 NOTE — Progress Notes (Signed)
Medicine attending: Medical history, presenting problems, and medications, reviewed with resident physician Dr Justin Helberg on the day of the patient telephone conversation and I concur with his evaluation and management plan. 

## 2018-12-18 NOTE — Progress Notes (Signed)
   CC: Allergies  This is a telephone encounter between Shelby Pittman and [employer] on 12/18/2018 for allergies. The visit was conducted with the patient located at home and [facility] at [facility]. The patient's identity was confirmed using their DOB and current address. The patient has consented to being evaluated through a telephone encounter and understands the associated risks (an examination cannot be done and the patient may need to come in for an appointment) / benefits (allows the patient to remain at home, decreasing exposure to coronavirus). I personally spent 10 minutes on medical discussion.   HPI:  [name] is a 23 y.o. with PMH as below.   Please see A&P for assessment of the patient's acute and chronic medical conditions.   Past Medical History:  Diagnosis Date  . Asthma   . Migraine    Review of Systems:  Performed and all others negative.  Assessment & Plan:   See Encounters Tab for problem based charting.  Patient discussed with Dr. Cyndie Chime

## 2018-12-31 ENCOUNTER — Other Ambulatory Visit: Payer: Self-pay

## 2018-12-31 DIAGNOSIS — J4540 Moderate persistent asthma, uncomplicated: Secondary | ICD-10-CM

## 2018-12-31 MED ORDER — BUDESONIDE-FORMOTEROL FUMARATE 160-4.5 MCG/ACT IN AERO: 2.0000 | INHALATION_SPRAY | Freq: Two times a day (BID) | RESPIRATORY_TRACT | 2 refills | Status: DC

## 2018-12-31 MED ORDER — CETIRIZINE HCL 10 MG PO TABS: 10.0000 mg | ORAL_TABLET | Freq: Every day | ORAL | 11 refills | Status: DC

## 2018-12-31 MED FILL — CETIRIZINE HCL 10 MG TABS: 10 | 30 days supply | Qty: 30 | Fill #0

## 2018-12-31 MED FILL — SYMBICORT 160-4.5 MCG INH: 160-4.5 | 30 days supply | Qty: 10 | Fill #1

## 2018-12-31 MED FILL — VENTOLIN HFA 90 MCG INHALER: 108 (90 BAS | 25 days supply | Qty: 18 | Fill #0

## 2018-12-31 NOTE — Telephone Encounter (Signed)
Pt would like to speak with a nurse about meds. Please call back.

## 2018-12-31 NOTE — Telephone Encounter (Signed)
Called pt - stated she needs zyrtec and inhalers refilled d/t allergies; having problem breathing. Informed ventolin was refilled on 4/6 and sent to [facility] pharmacy. Please re-send other rxs to [facility] pharmacy

## 2019-01-01 NOTE — Telephone Encounter (Signed)
Called pt to inform her rxs have been refilled - "thanks for calling".

## 2019-01-06 ENCOUNTER — Other Ambulatory Visit: Payer: Self-pay

## 2019-01-06 ENCOUNTER — Encounter: Payer: Self-pay | Admitting: Internal Medicine

## 2019-01-06 ENCOUNTER — Ambulatory Visit (INDEPENDENT_AMBULATORY_CARE_PROVIDER_SITE_OTHER): Payer: Self-pay | Admitting: Internal Medicine

## 2019-01-06 VITALS — BP 124/67 | HR 60 | Temp 98.1°F | Wt 160.1 lb

## 2019-01-06 DIAGNOSIS — Z91048 Other nonmedicinal substance allergy status: Secondary | ICD-10-CM

## 2019-01-06 DIAGNOSIS — Z79899 Other long term (current) drug therapy: Secondary | ICD-10-CM

## 2019-01-06 DIAGNOSIS — J45909 Unspecified asthma, uncomplicated: Secondary | ICD-10-CM

## 2019-01-06 DIAGNOSIS — R21 Rash and other nonspecific skin eruption: Secondary | ICD-10-CM | POA: Insufficient documentation

## 2019-01-06 DIAGNOSIS — Z7951 Long term (current) use of inhaled steroids: Secondary | ICD-10-CM

## 2019-01-06 DIAGNOSIS — J4540 Moderate persistent asthma, uncomplicated: Secondary | ICD-10-CM

## 2019-01-06 HISTORY — DX: Rash and other nonspecific skin eruption: R21

## 2019-01-06 NOTE — Patient Instructions (Signed)
Thank you for visiting clinic today. Glad your rash has been improved. If you develop this type of symptoms again you can try using over-the-counter Benadryl cream or hydrocortisone cream if it involves just your fingers.  If you develop a rash all over your body you can take Benadryl tablet 25 mg and call our clinic.

## 2019-01-06 NOTE — Assessment & Plan Note (Signed)
There was no rash observed today.  Not sure which trigger her symptoms but there has been resolved.  Had a mild erythema at the base of right ring finger nail, per patient she did develop some infection due to her acrylic nail in that area which has been resolved now.  There was no tenderness or edema. No exacerbation of asthma or systemic signs.  -Advised patient to avoid any known allergens. -She can try using Benadryl cream or hydrocortisone cream if develops some pruritic rash over her fingers again. -She can also try using Benadryl tablet for a generalized rash if needed and will call our clinic for further evaluation.

## 2019-01-06 NOTE — Assessment & Plan Note (Signed)
No sign of acute exacerbation.  No wheezing or shortness of breath. Does not required her rescue inhaler recently.  She will continue with Singulair and Symbicort.

## 2019-01-06 NOTE — Progress Notes (Signed)
   CC: Rash over her hand and face last week.  HPI:  Ms.Lateefah S Simson is a 23 y.o. past medical history as listed below came to the clinic with complaint of pruritic rash over her hand and face last week.  Rash has been improved since then.  She denies any recent use of a new medicine or food.  She was unable to correlate that rash with anything.  Denies any change in her detergent or personal hygiene stuff.  Patient states that she is allergic to acrylic but having acrylic nails. She denies any upper respiratory symptoms or shortness of breath.  Denies any fever or chills.  Past Medical History:  Diagnosis Date  . Asthma   . Migraine    Review of Systems: Negative except mentioned in HPI.  Physical Exam:  Vitals:   01/06/19 1024  BP: 124/67  Pulse: 60  Temp: 98.1 F (36.7 C)  TempSrc: Oral  SpO2: 100%  Weight: 160 lb 1.6 oz (72.6 kg)   General: Vital signs reviewed.  Patient is well-developed and well-nourished, in no acute distress and cooperative with exam.  Head: Normocephalic and atraumatic. Eyes: EOMI, conjunctivae normal, no scleral icterus.  Cardiovascular: RRR, S1 normal, S2 normal, no murmurs, gallops, or rubs. Pulmonary/Chest: Clear to auscultation bilaterally, no wheezes, rales, or rhonchi. Abdominal: Soft, non-tender, non-distended, BS +,  Extremities: No lower extremity edema bilaterally,  pulses symmetric and intact bilaterally. No cyanosis or clubbing. Skin: Warm, dry and intact. No rashes, mild erythema at the base of left ring finger. Psychiatric: Normal mood and affect. speech and behavior is normal. Cognition and memory are normal.  Assessment & Plan:   See Encounters Tab for problem based charting.  Patient discussed with Dr. Cyndie Chime.

## 2019-01-07 NOTE — Progress Notes (Signed)
Medicine attending: Medical history, presenting problems, physical findings, and medications, reviewed with resident physician Dr Arnetha Courser on the day of the patient visit and I concur with her evaluation and management plan. Pt C/O transient rash. No rash on today's exam.

## 2019-01-15 ENCOUNTER — Ambulatory Visit: Payer: Self-pay

## 2019-02-10 ENCOUNTER — Other Ambulatory Visit: Payer: Self-pay | Admitting: Internal Medicine

## 2019-02-10 MED FILL — SYMBICORT 160-4.5 MCG INH: 160-4.5 | 30 days supply | Qty: 10 | Fill #0

## 2019-02-10 NOTE — Telephone Encounter (Signed)
Called pt - stated she called [facility] and they will refill her medication.

## 2019-02-10 NOTE — Telephone Encounter (Signed)
Pt needs a refill on fluticasone (FLONASE) 50 MCG/ACT nasal spray, pls contact pt to double check [facility]

## 2019-02-12 ENCOUNTER — Encounter: Payer: Self-pay | Admitting: *Deleted

## 2019-02-26 ENCOUNTER — Encounter: Payer: Self-pay | Admitting: Internal Medicine

## 2019-03-10 MED FILL — VENTOLIN HFA 90 MCG INHALER: 108 (90 BAS | 25 days supply | Qty: 18 | Fill #1

## 2019-03-10 MED FILL — SYMBICORT 160-4.5 MCG INH: 160-4.5 | 30 days supply | Qty: 10 | Fill #1

## 2019-03-12 ENCOUNTER — Encounter: Payer: Self-pay | Admitting: Internal Medicine

## 2019-04-01 NOTE — Progress Notes (Signed)
   CC: pap smear  HPI:  Shelby Pittman is a 23 y.o. with PMH as below presenting for shortness of breath and increased use of her inhalers.   Please see A&P for assessment of the patient's acute and chronic medical conditions.    Past Medical History:  Diagnosis Date  . Asthma   . Migraine    Review of Systems:   Review of Systems  HENT: Positive for congestion. Negative for ear pain, sinus pain and sore throat.   Eyes: Negative for blurred vision, pain and redness.  Respiratory: Positive for cough. Negative for sputum production, shortness of breath and wheezing.    Physical Exam:  Constitution: NAD, appears stated age HENT: turbinates inflamed with very limited patency of nasal passages, congestion Eyes: no icterus or injection  Cardio: rrr, no m/r/g  Respiratory: good air movement, CTA, no wheezing    Vitals:   04/02/19 1403  BP: 116/75  Pulse: 74  Temp: 98.4 F (36.9 C)  TempSrc: Oral  SpO2: 100%  Weight: 164 lb 4.8 oz (74.5 kg)     Assessment & Plan:   See Encounters Tab for problem based charting.  Patient discussed with Dr. Evette Doffing

## 2019-04-02 ENCOUNTER — Ambulatory Visit (INDEPENDENT_AMBULATORY_CARE_PROVIDER_SITE_OTHER): Payer: Self-pay | Admitting: Internal Medicine

## 2019-04-02 ENCOUNTER — Encounter: Payer: Self-pay | Admitting: Internal Medicine

## 2019-04-02 ENCOUNTER — Other Ambulatory Visit: Payer: Self-pay

## 2019-04-02 DIAGNOSIS — J45909 Unspecified asthma, uncomplicated: Secondary | ICD-10-CM

## 2019-04-02 DIAGNOSIS — J3089 Other allergic rhinitis: Secondary | ICD-10-CM

## 2019-04-02 DIAGNOSIS — Z79899 Other long term (current) drug therapy: Secondary | ICD-10-CM

## 2019-04-02 DIAGNOSIS — Z0000 Encounter for general adult medical examination without abnormal findings: Secondary | ICD-10-CM

## 2019-04-02 DIAGNOSIS — J4540 Moderate persistent asthma, uncomplicated: Secondary | ICD-10-CM

## 2019-04-02 DIAGNOSIS — Z7951 Long term (current) use of inhaled steroids: Secondary | ICD-10-CM

## 2019-04-02 DIAGNOSIS — J4520 Mild intermittent asthma, uncomplicated: Secondary | ICD-10-CM

## 2019-04-02 MED ORDER — FLUTICASONE-SALMETEROL 500-50 MCG/DOSE IN AEPB: 1.0000 | INHALATION_SPRAY | Freq: Two times a day (BID) | RESPIRATORY_TRACT | 2 refills | Status: DC

## 2019-04-02 MED ORDER — ALBUTEROL SULFATE HFA 108 (90 BASE) MCG/ACT IN AERS: INHALATION_SPRAY | RESPIRATORY_TRACT | 2 refills | Status: DC

## 2019-04-02 MED FILL — VENTOLIN HFA 90 MCG INHALER: 108 (90 BAS | 25 days supply | Qty: 18 | Fill #2

## 2019-04-02 MED FILL — SYMBICORT 160-4.5 MCG INH: 160-4.5 | 30 days supply | Qty: 10 | Fill #2

## 2019-04-02 NOTE — Patient Instructions (Signed)
Thank you for allowing [place] to provide your care today. Today we discussed your symptoms from asthma and inflamed nasal passages.   Today we made the following changes to your medications:   Please START taking  Fluticasone-Salmeterol - take 1 puff 2 times per day   Please STOP taking   Symbicort  Please call with the information of your over the counter nasal medication.   Please follow-up in two weeks if symptoms do not improve.  Please make sure to follow up with your Ear, Nose, and Throat doctor for evaluation of your nasal passages.     Please call the internal medicine center clinic if you have any questions or concerns, we may be able to help and keep you from a long and expensive emergency room wait. Our clinic and after hours phone number is [phone], the best time to call is Monday through Friday 9 am to 4 pm but there is always someone available 24/7 if you have an emergency. If you need medication refills please notify your pharmacy one week in advance and they will send [place] a request.

## 2019-04-03 DIAGNOSIS — Z0000 Encounter for general adult medical examination without abnormal findings: Secondary | ICD-10-CM | POA: Insufficient documentation

## 2019-04-03 NOTE — Assessment & Plan Note (Signed)
-   f/u one month for pap smear. She is on her menstrual cycle today and was not aware pap was planned.

## 2019-04-03 NOTE — Progress Notes (Signed)
Internal Medicine Clinic Attending  Case discussed with Dr. Seawell at the time of the visit.  We reviewed the resident's history and exam and pertinent patient test results.  I agree with the assessment, diagnosis, and plan of care documented in the resident's note.    

## 2019-04-03 NOTE — Assessment & Plan Note (Signed)
See asthma

## 2019-04-03 NOTE — Assessment & Plan Note (Signed)
She is using her albuterol three times per day and feels like the heat is causing her to feel more short of breath, especially over the last couple weeks as her symptoms were not as bad in the spring. Her allergies have also been increased. She has woken up the last three nights due to cough and having difficulty breathing.  She does feel this is partially related to her chronically inflamed nasal passages. Surgery was planned for this, but she has not heard from ENT since last December. On physical exam she has good air movement without wheezing. Turbinates are significantly inflamed and her nasal passages her very limited patency.  She also has not had PFTs, she should be able to get her benefits from [place] soon and then is agreeable to this  - f/u with ENT to discuss possible surgery  - switch symbicort to advair 500-50  - continue zyrtec  - continue flonase, stop over the counter nasal spray, discussed possibility of rebound congestion - f/u if symptoms do not improve  - No PFTs, asthma diagnosed as a child. She should be obtaining insurance soon through [employer], will do PFTs at this time

## 2019-04-03 NOTE — Addendum Note (Signed)
Addended by: Lalla Brothers T on: 04/03/2019 02:00 PM   Modules accepted: Level of Service

## 2019-05-12 ENCOUNTER — Other Ambulatory Visit: Payer: Self-pay | Admitting: Internal Medicine

## 2019-05-12 DIAGNOSIS — J4540 Moderate persistent asthma, uncomplicated: Secondary | ICD-10-CM

## 2019-05-13 NOTE — Telephone Encounter (Signed)
Recently switched to advair. Does she need a refill on advair?

## 2019-05-14 NOTE — Telephone Encounter (Signed)
Called pt - no answer. Advair was filled 8/5 x 2 RF.

## 2019-05-16 ENCOUNTER — Other Ambulatory Visit: Payer: Self-pay

## 2019-05-16 DIAGNOSIS — J4540 Moderate persistent asthma, uncomplicated: Secondary | ICD-10-CM

## 2019-05-16 DIAGNOSIS — J4520 Mild intermittent asthma, uncomplicated: Secondary | ICD-10-CM

## 2019-05-16 MED ORDER — ALBUTEROL SULFATE HFA 108 (90 BASE) MCG/ACT IN AERS: INHALATION_SPRAY | RESPIRATORY_TRACT | 2 refills | Status: DC

## 2019-05-16 MED ORDER — FLUTICASONE-SALMETEROL 500-50 MCG/DOSE IN AEPB: 1.0000 | INHALATION_SPRAY | Freq: Two times a day (BID) | RESPIRATORY_TRACT | 2 refills | Status: DC

## 2019-05-16 MED FILL — ALBUTEROL SULFATE HFA 108 (: 108 (90 BAS | 25 days supply | Qty: 18 | Fill #0

## 2019-05-16 MED FILL — ADVAIR 500/50 DISKUS: 500-50 | 30 days supply | Qty: 60 | Fill #0

## 2019-05-16 NOTE — Telephone Encounter (Signed)
Pt's mother requesting to speak with a nurse about getting refill on inhaler. Please call back.

## 2019-05-16 NOTE — Telephone Encounter (Signed)
Patient called back. States she would like her mom to be able to speak for her in future. Asked that she please put this in writing. Made aware that request has been sent to PCP for albuterol with [facility]. Explained that she was switched from Symbicort to Advair on 04/02/2019. Patient states she was unaware but is willing to try this. Will route to PCP to resend Rx for Advair with [facility] as well. Hubbard Hartshorn, RN, BSN

## 2019-05-16 NOTE — Telephone Encounter (Signed)
Rxs for Albuterol and Advair sent with 2 refills on 04/02/2019. Patient should not need refill at this time. Returned call to patient. No answer. Left message on VM requesting return call. Spoke with Manuela Schwartz, Pharmacist at [facility]. States patient was unable to pick up refill of albuterol on 05/12/2019 as it did not say IM Program and patient cannot afford it ($49). Patient insisted today with Manuela Schwartz that she is not on Advair but on Symbicort. Symbicort was d/c at [facility] with PCP on 04/02/2019 and switched to Advair. Hubbard Hartshorn, RN, BSN

## 2019-05-16 NOTE — Telephone Encounter (Signed)
Pt's mother requesting to speak with a nurse about her daughter med. Please call back.

## 2019-05-16 NOTE — Telephone Encounter (Addendum)
Confirmed with Manuela Schwartz at [facility] she received these Rxs. Should be ready in about 30 minutes. Mom notified and is appreciative. Mom understands the need for patient to give permission to speak with her. Advised DPR be signed at next [place]. Hubbard Hartshorn, RN, BSN

## 2019-06-05 ENCOUNTER — Ambulatory Visit: Payer: Self-pay

## 2019-06-06 ENCOUNTER — Ambulatory Visit (INDEPENDENT_AMBULATORY_CARE_PROVIDER_SITE_OTHER): Payer: Self-pay | Admitting: Internal Medicine

## 2019-06-06 ENCOUNTER — Encounter: Payer: Self-pay | Admitting: Internal Medicine

## 2019-06-06 ENCOUNTER — Other Ambulatory Visit: Payer: Self-pay

## 2019-06-06 VITALS — BP 113/63 | HR 70 | Wt 158.4 lb

## 2019-06-06 DIAGNOSIS — J4540 Moderate persistent asthma, uncomplicated: Secondary | ICD-10-CM

## 2019-06-06 DIAGNOSIS — J45909 Unspecified asthma, uncomplicated: Secondary | ICD-10-CM

## 2019-06-06 DIAGNOSIS — Z7951 Long term (current) use of inhaled steroids: Secondary | ICD-10-CM

## 2019-06-06 DIAGNOSIS — F332 Major depressive disorder, recurrent severe without psychotic features: Secondary | ICD-10-CM

## 2019-06-06 DIAGNOSIS — F419 Anxiety disorder, unspecified: Secondary | ICD-10-CM

## 2019-06-06 DIAGNOSIS — Z79899 Other long term (current) drug therapy: Secondary | ICD-10-CM

## 2019-06-06 DIAGNOSIS — F339 Major depressive disorder, recurrent, unspecified: Secondary | ICD-10-CM

## 2019-06-06 MED ORDER — SERTRALINE HCL 25 MG PO TABS: 25.0000 mg | ORAL_TABLET | Freq: Every day | ORAL | 2 refills | Status: DC

## 2019-06-06 MED FILL — SERTRALINE HCL 25 MG TABLET: 25 | 30 days supply | Qty: 30 | Fill #0

## 2019-06-06 NOTE — Progress Notes (Signed)
   CC: Depressed   HPI:[name] is a 23 y.o. with past medical history below.  Please see problem please charting for patient, assessment, and plan.  Past Medical History:  Diagnosis Date  . Asthma   . Migraine    Review of Systems: Review of Systems  Constitutional: Negative for chills and fever.  Psychiatric/Behavioral: Positive for depression. Negative for suicidal ideas. The patient is nervous/anxious.      Vitals:   06/06/19 0931  BP: 113/63  Pulse: 70  SpO2: 100%  Weight: 158 lb 6.4 oz (71.8 kg)   Physical Exam:  Physical Exam Constitutional:      Appearance: Normal appearance.  Cardiovascular:     Rate and Rhythm: Normal rate and regular rhythm.  Pulmonary:     Breath sounds: No wheezing, rhonchi or rales.  Skin:    General: Skin is warm and dry.  Neurological:     Mental Status: She is alert.  Psychiatric:     Comments: Depressed mood. Reserved affect      Assessment & Plan:   See Encounters Tab for problem based charting.  Patient seen with Dr. Dareen Piano

## 2019-06-06 NOTE — Assessment & Plan Note (Addendum)
Reports Asthma well controlled. She is not waking up with symptoms and rarely needs to use rescue inhaler.  - Continue advair , albuterol PRN - continue zyrtec  - continue flonase

## 2019-06-06 NOTE — Patient Instructions (Addendum)
Thank you for trusting me with your care. To recap, today we discussed the following:   Depression - Start Sertraline (Zoloft) 25 mg  today , this medication may take 2 to 4 weeks to reach max effect - Referred to integrative behavioral therapy with Thera Flake  Follow up in 4 weeks to see how things are going. If you start to feel worse give the clinic a call and make an appointment If you have any feeling of harming yourself please come to the emergeny room. I recommend spending time with  friends and family as much as possible until you start feeling better.   My best,  Tamsen Snider, MD

## 2019-06-06 NOTE — Assessment & Plan Note (Signed)
Patient first reported to have depression in 2018, started on lexapro. Reports this medication helped with symptoms, but ran out of the medication in December of 2019. Medication not refilled because she was concerned with weight gain. Patient reports she has felt depressed for month, but was coping. After losing her job this past week she says her depression worsened . PHQ-9 score is 19. Denies HI, SI.  - Start Sertraline (Zoloft) 25 mg  today , this medication may take 2 to 4 weeks to reach max effect - Referred to integrative behavioral therapy with Thera Flake - Follow up in 4 weeks or sooner if needed - Ask to spend time with family./friends while feeling down and go to emergency room if she has thoughts of harming herself

## 2019-06-10 ENCOUNTER — Telehealth: Payer: Self-pay | Admitting: Licensed Clinical Social Worker

## 2019-06-10 ENCOUNTER — Encounter: Payer: Self-pay | Admitting: Licensed Clinical Social Worker

## 2019-06-10 NOTE — Telephone Encounter (Signed)
Patient was contacted (1st attempt) to discuss her referral. Patient did not answer, and a vm was left for the patient. A letter will also be mailed today.

## 2019-06-13 MED FILL — ALBUTEROL SULFATE HFA 108 (: 108 (90 BAS | 25 days supply | Qty: 18 | Fill #1

## 2019-06-13 MED FILL — ADVAIR 500/50 DISKUS: 500-50 | 30 days supply | Qty: 60 | Fill #1

## 2019-06-18 ENCOUNTER — Ambulatory Visit (INDEPENDENT_AMBULATORY_CARE_PROVIDER_SITE_OTHER): Payer: Self-pay | Admitting: Licensed Clinical Social Worker

## 2019-06-18 ENCOUNTER — Other Ambulatory Visit: Payer: Self-pay

## 2019-06-18 ENCOUNTER — Telehealth: Payer: Self-pay

## 2019-06-18 DIAGNOSIS — F320 Major depressive disorder, single episode, mild: Secondary | ICD-10-CM

## 2019-06-18 NOTE — Telephone Encounter (Signed)
Pt's states she cannot come in for the appt, instead she want you to called her.

## 2019-06-18 NOTE — BH Specialist Note (Signed)
Integrated Behavioral Health Visit via Telemedicine (Telephone)  06/18/2019 Shelby Pittman [id number]   Session Start time: 11:35  Session End time: 11:50 Total time: 15 minutes (ended early due to conflict for patient's schedule)  Referring Provider: Dr. Court Joy Type of Visit: Telephonic Patient location: [name] Provider location: Office All persons participating in visit: Patient and [name]  Confirmed patient's address: Yes  Confirmed patient's phone number: Yes  Any changes to demographics: No   Discussed confidentiality: Yes    The following statements were read to the patient and/or legal guardian that are established with the [facility] Provider.  "The purpose of this phone visit is to provide behavioral health care while limiting exposure to the coronavirus (COVID19).  There is a possibility of technology failure and discussed alternative modes of communication if that failure occurs."  "By engaging in this telephone visit, you consent to the provision of healthcare.  Additionally, you authorize for your insurance to be billed for the services provided during this telephone visit."   Patient and/or legal guardian consented to telephone visit: Yes   PRESENTING CONCERNS: Patient and/or family reports the following symptoms/concerns: anxiety, depression, and stress. Duration of problem: increased over the past several months; Severity of problem: moderate  GOALS ADDRESSED: Patient will: 1.  Reduce symptoms of: anxiety, depression and stress  2.  Increase knowledge and/or ability of: coping skills, healthy habits and stress reduction  3.  Demonstrate ability to: Increase healthy adjustment to current life circumstances and Increase adequate support systems for patient/family  INTERVENTIONS: Interventions utilized:  Solution-Focused Strategies, Brief CBT and Supportive Counseling Standardized Assessments completed: assessed for SI, HI, and  self-harm.  ASSESSMENT: Patient currently experiencing challenges adjusting to losing her job. Patient recently lost her job. Patient is currently applying for other positions, and hopeful that she can gain a new job. Patient identified she had mild anxiety and depression. Patient feels hopeless and anxious. Patient reported she lacks natural supports, and feels isolated. Patient denies any thoughts of self-harm, SI, and HI>   Patient may benefit from counseling.  PLAN: 1. Follow up with behavioral health clinician on : two weeks.   Shelby Pittman, [facility], [place]

## 2019-06-19 ENCOUNTER — Encounter: Payer: Self-pay | Admitting: Licensed Clinical Social Worker

## 2019-06-25 ENCOUNTER — Ambulatory Visit: Payer: Self-pay

## 2019-06-25 ENCOUNTER — Encounter: Payer: Self-pay | Admitting: Internal Medicine

## 2019-06-25 ENCOUNTER — Other Ambulatory Visit: Payer: Self-pay

## 2019-06-26 ENCOUNTER — Encounter: Payer: Self-pay | Admitting: Internal Medicine

## 2019-06-26 ENCOUNTER — Other Ambulatory Visit: Payer: Self-pay

## 2019-06-26 ENCOUNTER — Ambulatory Visit (INDEPENDENT_AMBULATORY_CARE_PROVIDER_SITE_OTHER): Payer: Self-pay | Admitting: Internal Medicine

## 2019-06-26 VITALS — BP 125/75 | HR 63 | Temp 98.6°F | Ht 59.0 in | Wt 158.4 lb

## 2019-06-26 DIAGNOSIS — Z789 Other specified health status: Secondary | ICD-10-CM

## 2019-06-26 DIAGNOSIS — F329 Major depressive disorder, single episode, unspecified: Secondary | ICD-10-CM

## 2019-06-26 DIAGNOSIS — F320 Major depressive disorder, single episode, mild: Secondary | ICD-10-CM

## 2019-06-26 DIAGNOSIS — Z79899 Other long term (current) drug therapy: Secondary | ICD-10-CM

## 2019-06-26 DIAGNOSIS — G43109 Migraine with aura, not intractable, without status migrainosus: Secondary | ICD-10-CM

## 2019-06-26 DIAGNOSIS — G43909 Migraine, unspecified, not intractable, without status migrainosus: Secondary | ICD-10-CM

## 2019-06-26 DIAGNOSIS — Z975 Presence of (intrauterine) contraceptive device: Secondary | ICD-10-CM

## 2019-06-26 MED ORDER — TOPIRAMATE 25 MG PO TABS: ORAL_TABLET | ORAL | 0 refills | Status: DC

## 2019-06-26 MED FILL — TOPIRAMATE 25 MG TAB: 25 | 14 days supply | Qty: 14 | Fill #0

## 2019-06-26 NOTE — Progress Notes (Signed)
   CC: Implanon, migraines, MDD  HPI:  [name] is a 23 y.o. female with PMHx listed below presenting for Implanon, migraines, MDD. Please see the A&P for the status of the patient's chronic medical problems.  Past Medical History:  Diagnosis Date  . Asthma   . Migraine    Review of Systems:  Performed and all others negative.  Physical Exam: Vitals:   06/26/19 1432  BP: 125/75  Pulse: 63  Temp: 98.6 F (37 C)  TempSrc: Oral  Weight: 158 lb 6.4 oz (71.8 kg)  Height: 4\' 11"  (1.499 m)   General: Well nourished female in no acute distress Pulm: Good air movement with no wheezing or crackles  CV: RRR, no murmurs, no rubs   Assessment & Plan:   See Encounters Tab for problem based charting.  Patient discussed with Dr. Evette Doffing

## 2019-06-26 NOTE — Assessment & Plan Note (Signed)
Patient has been on topiramate for migraine prophylaxis. She states that her migraines are doing significantly better. She is planning to get pregnant. We discussed that this medication will need to be tapered off. She is currently on 25 mg twice daily. We will decrease this to 25 mg once daily for two weeks and then discontinue completely.

## 2019-06-26 NOTE — Assessment & Plan Note (Signed)
Patient evaluated on 10/9 depressive symptoms. She currently endorses insomnia, lack of interest, feelings of guilt, decreased energy, decreased concentration, and increased appetite. She denies SI/HI. Her PHQ nine score on 10/9 was 19. Today it is 16. She has not started the Zoloft as her sister told her that there were a lot of side effects. She is only taken a total of three doses since her last visit. We discussed that the medication can take 4 to 6 weeks to become effective in the importance of taking it daily. We discussed side effects including G.I. and sexual side effects. She voices understanding.  A/P: - Continue Sertraline 50 mg QD  - Continue counseling - Follow-up in 4 weeks

## 2019-06-26 NOTE — Patient Instructions (Signed)
Thank you for allowing [place] to provide your care. Please start taking the sertraline daily. We would like to have you back in approximately four weeks to assess your response. At that point we may elect to increase the medication.  For your implanon we will get you referred to OB/GYN for removal. If you have not heard from them within the next 1 to 2 weeks please let us know.  Please come back to see [place] in four weeks or sooner if any issues arise.

## 2019-06-26 NOTE — Assessment & Plan Note (Signed)
Patient with an implanon in place since 2014. She is interested in getting it removed. She plans to try to conceive with her boyfriend. We discussed starting a prenatal vitamin. We also reviewed her medications. She will need to taper off her topiramate if she is planning to get pregnant.  A/P: - Referral to OB/GYN for Implanon removal  - Start prenatal vitamin  - Taper Topiramate

## 2019-06-27 NOTE — Progress Notes (Signed)
Internal Medicine Clinic Attending  Case discussed with Dr. Helberg at the time of the visit.  We reviewed the resident's history and exam and pertinent patient test results.  I agree with the assessment, diagnosis, and plan of care documented in the resident's note.    

## 2019-07-01 NOTE — Progress Notes (Signed)
Internal Medicine Clinic Attending  I saw and evaluated the patient.  I personally confirmed the key portions of the history and exam documented by Dr. Steen and I reviewed pertinent patient test results.  The assessment, diagnosis, and plan were formulated together and I agree with the documentation in the resident's note.     

## 2019-07-02 ENCOUNTER — Encounter: Payer: Self-pay | Admitting: Internal Medicine

## 2019-07-02 ENCOUNTER — Ambulatory Visit: Payer: Self-pay | Admitting: Licensed Clinical Social Worker

## 2019-07-09 ENCOUNTER — Ambulatory Visit: Payer: Self-pay

## 2019-07-10 MED FILL — ADVAIR 500/50 DISKUS: 500-50 | 30 days supply | Qty: 60 | Fill #2

## 2019-07-10 MED FILL — ALBUTEROL SULFATE HFA 108 (: 108 (90 BAS | 25 days supply | Qty: 18 | Fill #2

## 2019-07-10 MED FILL — TOPIRAMATE 25 MG TAB: 25 | 14 days supply | Qty: 14 | Fill #0

## 2019-07-22 ENCOUNTER — Ambulatory Visit (INDEPENDENT_AMBULATORY_CARE_PROVIDER_SITE_OTHER): Payer: Self-pay | Admitting: Internal Medicine

## 2019-07-22 ENCOUNTER — Telehealth: Payer: Self-pay | Admitting: Internal Medicine

## 2019-07-22 ENCOUNTER — Other Ambulatory Visit: Payer: Self-pay

## 2019-07-22 ENCOUNTER — Encounter: Payer: Self-pay | Admitting: Internal Medicine

## 2019-07-22 DIAGNOSIS — R519 Headache, unspecified: Secondary | ICD-10-CM

## 2019-07-22 DIAGNOSIS — R0981 Nasal congestion: Secondary | ICD-10-CM

## 2019-07-22 HISTORY — DX: Nasal congestion: R09.81

## 2019-07-22 NOTE — Telephone Encounter (Signed)
Ms.Eguia was seen by Dr.Helberg at last visit. Dr.Helberg put a referral in at this time to have Birth Control Implant removed. In addition Dr.Helberg discussed 2 week taper and then discontinuing Topiramate. Topiramate is a teratogen. Patient should stop taking this medication if she is thinking about becoming pregnant. In addition she should be taking the prenatal vitamin discussed. I agree with Dr.V's recommendation for a Televisit .

## 2019-07-22 NOTE — Progress Notes (Signed)
  [facility] Internal Medicine Residency Telephone Encounter Continuity Care Appointment  HPI:   This telephone encounter was created for [name] on 07/22/2019 for the following purpose/cc sinus congestion and HA.   Past Medical History:  Past Medical History:  Diagnosis Date  . Asthma   . Migraine       ROS:   Sinus congestion and HA  Denies fever, chills, SOB, cough, sore throat, N/V, and diarrhea.    Assessment / Plan / Recommendations:   Please see A&P under problem oriented charting for assessment of the patient's acute and chronic medical conditions.   As always, pt is advised that if symptoms worsen or new symptoms arise, they should go to an urgent care facility or to to ER for further evaluation.   Consent and Medical Decision Making:   Patient discussed with Dr. Evette Doffing  This is a telephone encounter between Shelby Pittman and Lloyd Cullinan Santos-Sanchez on 07/22/2019 for sinus congestion and HA. The visit was conducted with the patient located at home and Welford Roche at [facility]. The patient's identity was confirmed using their DOB and current address. The patient has consented to being evaluated through a telephone encounter and understands the associated risks (an examination cannot be done and the patient may need to come in for an appointment) / benefits (allows the patient to remain at home, decreasing exposure to coronavirus). I personally spent 8 minutes on medical discussion.

## 2019-07-22 NOTE — Telephone Encounter (Signed)
Head Congested, sinus problems, headaches and taking over the counter medicine that is not working.  Please call patient back

## 2019-07-22 NOTE — Assessment & Plan Note (Signed)
Patient called with a 4-day history of headaches, sinus congestion, and facial pain.  She has been trying DayQuil and NyQuil with minimal relief of symptoms.  She denies fever, chills, N/V, sore throat, diarrhea, shortness of breath, and cough.  She has no known Covid exposures. Unable to perform a physical exam as this is a telehealth visit, but patient was speaking in full sentences and did not appear to be in any respiratory distress.   I suspect this is a viral sinusitis, but unable to rule out COVID. I advised her to get COVID testing at one of the testing centers in [place] and to self-quarantine pending results. Recommended she calls [place] or goes to the ED if she develops respiratory symptoms. Also recommended continuing OTC decongestants as well as nasal irrigation. Will call her next week to ensure resolution of symptoms. If symptoms persists, would treat her for acute bacterial sinusitis with Augmentin.

## 2019-07-22 NOTE — Telephone Encounter (Signed)
Shall we make telehealth appt? Suggest COVID testing? Please advise Sending to attending and pcp

## 2019-07-22 NOTE — Telephone Encounter (Signed)
Dr Court Joy pt asking about her headache medicine, states she needs refill also asking about GYN appt.

## 2019-07-22 NOTE — Telephone Encounter (Signed)
I would recommend televisit with [facility] to start with.

## 2019-07-23 ENCOUNTER — Ambulatory Visit: Payer: Self-pay

## 2019-07-23 NOTE — Progress Notes (Signed)
Internal Medicine Clinic Attending  Case discussed with Dr. Santos-Sanchez at the time of the visit.  We reviewed the resident's history and exam and pertinent patient test results.  I agree with the assessment, diagnosis, and plan of care documented in the resident's note.    

## 2019-07-30 ENCOUNTER — Telehealth: Payer: Self-pay | Admitting: Licensed Clinical Social Worker

## 2019-07-30 NOTE — Telephone Encounter (Signed)
Pt calling to speak to Counselor Dessie Coma.

## 2019-07-30 NOTE — Telephone Encounter (Signed)
Have called and lm on both ph#s listed for appt sch.

## 2019-08-04 ENCOUNTER — Other Ambulatory Visit: Payer: Self-pay | Admitting: Internal Medicine

## 2019-08-04 NOTE — Telephone Encounter (Signed)
Needs refill on headache medicine also suppose to low dosage; pt contact [facility], [place].

## 2019-08-06 ENCOUNTER — Ambulatory Visit: Payer: Self-pay | Admitting: Obstetrics & Gynecology

## 2019-08-06 ENCOUNTER — Telehealth: Payer: Self-pay | Admitting: Licensed Clinical Social Worker

## 2019-08-06 ENCOUNTER — Encounter: Payer: Self-pay | Admitting: Obstetrics & Gynecology

## 2019-08-06 ENCOUNTER — Other Ambulatory Visit: Payer: Self-pay

## 2019-08-06 VITALS — BP 138/94 | HR 75 | Wt 153.0 lb

## 2019-08-06 DIAGNOSIS — Z3046 Encounter for surveillance of implantable subdermal contraceptive: Secondary | ICD-10-CM

## 2019-08-06 NOTE — Progress Notes (Signed)
     GYNECOLOGY OFFICE PROCEDURE NOTE  Shelby Pittman is a 23 y.o. F here for Nexplanon removal. No recent pap smears, denies abnormal pap smears.  No other gynecologic concerns.  Nexplanon Removal Patient identified, informed consent performed, consent signed.   Appropriate time out taken. Nexplanon site identified.  Area prepped in usual sterile fashon. One ml of 1% lidocaine was used to anesthetize the area at the distal end of the implant. A small stab incision was made right beside the implant on the distal portion.  The Nexplanon rod was grasped using hemostats and removed without difficulty.  There was minimal blood loss. There were no complications.  3 ml of 1% lidocaine was injected around the incision for post-procedure analgesia.  Steri-strips were applied over the small incision.  A pressure bandage was applied to reduce any bruising.  The patient tolerated the procedure well and was given post procedure instructions.  Patient is planning to attempt conception. Recommended prenatal vitamins, avoidance of teratogens and use of ovulation apps to help. She will get pap smear soon at [facility] or other facility.    Verita Schneiders, MD, [place] for [employer], [place]

## 2019-08-06 NOTE — Telephone Encounter (Signed)
Patient was called back. Patient will be added to my schedule for a visit on 12/30.

## 2019-08-06 NOTE — Patient Instructions (Signed)
Nexplanon Instructions After Removal  Keep bandage clean and dry for 24 hours  May use ice/Tylenol/Ibuprofen for soreness or pain  If you develop fever, drainage or increased warmth from incision site-contact office immediately   

## 2019-08-07 NOTE — Telephone Encounter (Signed)
Patient will need to be seen via Televisit - as noted on previous request .   Shelby Pittman was seen by Dr.Helberg . Dr.Helberg put a referral in at this time to have Birth Control Implant removed. In addition Dr.Helberg discussed 2 week taper and then discontinuing Topiramate. Topiramate is a teratogen. Patient should stop taking this medication if she is thinking about becoming pregnant. In addition she should be taking the prenatal vitamin discussed.   If plans have changed we could refill medications, but will need visit to discuss this further.

## 2019-08-08 ENCOUNTER — Telehealth: Payer: Self-pay | Admitting: Internal Medicine

## 2019-08-08 ENCOUNTER — Encounter: Payer: Self-pay | Admitting: Internal Medicine

## 2019-08-08 NOTE — Telephone Encounter (Signed)
Telehealth appt has been scheduled for today.

## 2019-08-08 NOTE — Telephone Encounter (Signed)
Patient called to inquire why she was referred to [facility] clinic for removal of her birth control. She was upset about why she had to pay out of pocket for the removal. She was apparently told by her gynecologist's office to check with her pcps office about whether she can be referred to another clinic for lower cost care.   I have spoken to lela about this and forwarded this message to pcp and office staff for further evaluation.   Lars Mage, MD Internal Medicine PGY3 Pager:[phone] 08/08/2019, 12:11 PM

## 2019-08-11 NOTE — Telephone Encounter (Signed)
I agree

## 2019-08-13 NOTE — Progress Notes (Signed)
This encounter was created in error - please disregard.

## 2019-08-15 ENCOUNTER — Encounter: Payer: Self-pay | Admitting: Internal Medicine

## 2019-08-15 ENCOUNTER — Other Ambulatory Visit: Payer: Self-pay

## 2019-08-15 ENCOUNTER — Ambulatory Visit (INDEPENDENT_AMBULATORY_CARE_PROVIDER_SITE_OTHER): Payer: Self-pay | Admitting: Internal Medicine

## 2019-08-15 ENCOUNTER — Other Ambulatory Visit: Payer: Self-pay | Admitting: Internal Medicine

## 2019-08-15 VITALS — BP 123/83 | HR 68 | Temp 98.4°F | Wt 159.5 lb

## 2019-08-15 DIAGNOSIS — J4540 Moderate persistent asthma, uncomplicated: Secondary | ICD-10-CM

## 2019-08-15 DIAGNOSIS — Z7951 Long term (current) use of inhaled steroids: Secondary | ICD-10-CM

## 2019-08-15 DIAGNOSIS — J4520 Mild intermittent asthma, uncomplicated: Secondary | ICD-10-CM

## 2019-08-15 DIAGNOSIS — Z79899 Other long term (current) drug therapy: Secondary | ICD-10-CM

## 2019-08-15 DIAGNOSIS — J3089 Other allergic rhinitis: Secondary | ICD-10-CM

## 2019-08-15 DIAGNOSIS — R040 Epistaxis: Secondary | ICD-10-CM

## 2019-08-15 MED FILL — ALBUTEROL SULFATE HFA 108 (: 108 (90 BAS | 25 days supply | Qty: 18 | Fill #0

## 2019-08-15 MED FILL — ADVAIR 500/50 DISKUS: 500-50 | 30 days supply | Qty: 60 | Fill #0

## 2019-08-15 NOTE — Progress Notes (Signed)
   CC: Sinus congestion, Asthma  HPI:  Shelby Pittman is a 23 y.o. female with PMHx listed below presenting for sinus congestion and asthma. Please see the A&P for the status of the patient's chronic medical problems.  Past Medical History:  Diagnosis Date  . Asthma   . Migraine    Review of Systems:  Performed and all others negative.  Physical Exam: Vitals:   08/15/19 0918  BP: 123/83  Pulse: 68  SpO2: 100%  Weight: 159 lb 8 oz (72.3 kg)   General: Well nourished female in no acute distress HENT: Normocephalic, atraumatic, moist mucus membranes Pulm: Good air movement with no wheezing or crackles  CV: RRR, no murmurs, no rubs   Assessment & Plan:   See Encounters Tab for problem based charting.  Patient discussed with Dr. Evette Doffing

## 2019-08-15 NOTE — Progress Notes (Signed)
Internal Medicine Clinic Attending  Case discussed with Dr. Helberg at the time of the visit.  We reviewed the resident's history and exam and pertinent patient test results.  I agree with the assessment, diagnosis, and plan of care documented in the resident's note.    

## 2019-08-15 NOTE — Patient Instructions (Signed)
Thank you for allowing [place] to provide your care. Today we discussed how to properly use your Flonase and the benefits of sinus irrigation. I have also referred you on to allergy and immunology. They can perform some more testing to help [place] better understand how to treat your asthma. Please come back to see Dr. Barbaraann Faster in three months or sooner if any issues arise.  How to Perform a Sinus Rinse A sinus rinse is a home treatment. It rinses your sinuses with a mixture of salt and water (saline solution). Sinuses are air-filled spaces in your skull behind the bones of your face and forehead. They open into your nasal cavity. A sinus rinse can help to clear your nasal cavity. It can clear mucus, dirt, dust, or pollen. You may do a sinus rinse when you have:  A cold.  A virus.  Allergies.  A sinus infection.  A stuffy nose. Talk with your doctor about whether a sinus rinse might help you. What are the risks? A sinus rinse is normally very safe and helpful. However, there are a few risks. These include:  A burning feeling in the sinuses. This may happen if you do not make the saline solution as instructed. Be sure to follow all directions when making the saline solution.  Nasal irritation.  Infection from unclean water. This is rare, but possible. Do not do a sinus rinse if you have had:  Ear or nasal surgery.  An ear infection.  Blocked ears. Supplies needed:  Saline solution or powder.  Distilled or germ-free (sterile) water may be needed to mix with saline powder. ? You may use boiled and cooled tap water. Boil tap water for 5 minutes; cool until it is lukewarm. Use within 24 hours. ? Do not use regular tap water to mix with the saline solution.  Neti pot or nasal rinse bottle. This releases the saline solution into your nose and through your sinuses. You can buy neti pots and rinse bottles: ? At your local pharmacy. ? At a health food store. ? Online. How to perform a sinus  rinse  1. Wash your hands with soap and water. 2. Wash your device using the directions that came with it. 3. Dry your device. 4. Use the solution that comes with your device or one that is sold separately in stores. Follow the mixing directions on the package if you need to mix with sterile or distilled water. 5. Fill your device with the amount of saline solution stated in the device instructions. 6. Stand over a sink and tilt your head sideways over the sink. 7. Place the spout of the device in your upper nostril (the one closer to the ceiling). 8. Gently pour or squeeze the saline solution into your nasal cavity. The liquid should drain to your lower nostril if you are not too stuffed up (congested). 9. While rinsing, breathe through your open mouth. 10. Gently blow your nose to clear any mucus and rinse solution. Blowing too hard may cause ear pain. 11. Repeat in your other nostril. 12. Clean and rinse your device with clean water. 13. Air-dry your device. Talk with your doctor or pharmacist if you have questions about how to do a sinus rinse. Summary  A sinus rinse is a home treatment. It rinses your sinuses with a mixture of salt and water (saline solution).  A sinus rinse is normally very safe and helpful. Follow all instructions carefully.  Talk with your doctor about whether a sinus  rinse might help you. This information is not intended to replace advice given to you by your health care provider. Make sure you discuss any questions you have with your health care provider. Document Released: 03/11/2014 Document Revised: 06/11/2017 Document Reviewed: 06/11/2017 Elsevier Patient Education  2020 [employer].

## 2019-08-15 NOTE — Assessment & Plan Note (Signed)
Personal or family history of bleeding  Frequency  Severity   OTC supplements Use of flonase or azelastine

## 2019-08-15 NOTE — Assessment & Plan Note (Signed)
Patient with known allergic rhinitis. She states that for the last couple months it has been uncontrolled. She has been using her Flonase, Zyrtec, and Ocean Spray. She does not do any sinus irrigation. She states that when she uses the Flonase she typically tips her head backwards and let's a couple drops go into her nose. She typically feels that running down the back of her throat. She has noticed yellow mucus production bilaterally. Occasionally when she blows her nose several times throughout the day she will see streaks of blood. She is not tried anything else over-the-counter to help with her sinus congestion. On physical exam she has swollen, pale, boggy sinuses with occasional patches of erythema.  A/P: - Continue Flonase, Zyrtec, and Ocean Spray - Discussed how to properly use Flonase - Discussed using sinus irrigation prior to using her Flonase.

## 2019-08-15 NOTE — Assessment & Plan Note (Signed)
Patient with known asthma. She is currently on Fluticasone-Salmeterol 500-64mcg/dose 1 puff BID, Montelukast 10 mg QD, Cetirizine 10 mg QD, and Albuterol PRN. She has been taking all her medications as prescribed. She reports using her rescue albuterol 2 to 3 times per day. She has been doing this for over a year. She denies nocturnal symptoms. She states that she does have to pre-medicate with her albuterol before she does any strenuous activity. She is not had any recent hospitalizations or ED visits. She is never been evaluated by allergy/immunology. On physical exam she is good air movement without any wheezing.  A/P: - Uncontrolled, Moderately persistent asthma  - Continue Fluticasone-Salmeterol 500-49mcg/dose 1 puff BID, Montelukast 10 mg QD, Cetirizine 10 mg QD, and Albuterol PRN. - Refer to allergy and immunology

## 2019-08-27 ENCOUNTER — Ambulatory Visit (INDEPENDENT_AMBULATORY_CARE_PROVIDER_SITE_OTHER): Payer: Self-pay | Admitting: Licensed Clinical Social Worker

## 2019-08-27 ENCOUNTER — Encounter: Payer: Self-pay | Admitting: Licensed Clinical Social Worker

## 2019-08-27 ENCOUNTER — Other Ambulatory Visit: Payer: Self-pay

## 2019-08-27 DIAGNOSIS — F320 Major depressive disorder, single episode, mild: Secondary | ICD-10-CM

## 2019-08-27 NOTE — BH Specialist Note (Signed)
Integrated Behavioral Health Visit via Telemedicine (Telephone)  08/27/2019 [place] S [place] [id number]   Session Start time: 1:00  Session End time: 1:20 Total time: 58minutes  Referring Provider: Dr. Court Joy Type of Visit: Telephonic Patient location: Work [facility] Provider location: Office All persons participating in visit: patient and [facility]  Confirmed patient's address: Yes  Confirmed patient's phone number: Yes  Any changes to demographics: No   Discussed confidentiality: Yes    The following statements were read to the patient and/or legal guardian that are established with the [facility] Provider.  "The purpose of this phone visit is to provide behavioral health care while limiting exposure to the coronavirus (COVID19).  There is a possibility of technology failure and discussed alternative modes of communication if that failure occurs."  "By engaging in this telephone visit, you consent to the provision of healthcare.  Additionally, you authorize for your insurance to be billed for the services provided during this telephone visit."   Patient and/or legal guardian consented to telephone visit: Yes   PRESENTING CONCERNS: Patient and/or family reports the following symptoms/concerns: anxiety, depression, and stress.  Duration of problem: increased over the past several months; Severity of problem: mild  GOALS ADDRESSED: Patient will: 1.  Reduce symptoms of: depression  2.  Increase knowledge and/or ability of: coping skills and stress reduction  3.  Demonstrate ability to: Increase healthy adjustment to current life circumstances and Increase adequate support systems for patient/family  INTERVENTIONS: Interventions utilized:  Brief CBT and Supportive Counseling Standardized Assessments completed: Not Needed  ASSESSMENT: Patient currently experiencing mild levels of depression. Patient reported she has days where she will isolate, but they are occurring less  frequently than her previous session (only occurring once or twice a week). Patient's boyfriend is currently working out of town for the past month and a half. Patient is implementing healthy coping skills and natural supports as needed to keep her depression/stress at minimal levels.   Patient may benefit from counseling.  PLAN: 1. Follow up with behavioral health clinician on : three weeks.  Dessie Coma, [facility], [place]

## 2019-09-01 ENCOUNTER — Emergency Department (HOSPITAL_BASED_OUTPATIENT_CLINIC_OR_DEPARTMENT_OTHER)
Admission: EM | Admit: 2019-09-01 | Discharge: 2019-09-01 | Disposition: A | Discharge status: Home / Self Care | Payer: Self-pay | Attending: Emergency Medicine | Admitting: Emergency Medicine

## 2019-09-01 ENCOUNTER — Other Ambulatory Visit: Payer: Self-pay

## 2019-09-01 ENCOUNTER — Encounter (HOSPITAL_BASED_OUTPATIENT_CLINIC_OR_DEPARTMENT_OTHER): Payer: Self-pay

## 2019-09-01 DIAGNOSIS — R1032 Left lower quadrant pain: Secondary | ICD-10-CM | POA: Insufficient documentation

## 2019-09-01 DIAGNOSIS — K5900 Constipation, unspecified: Secondary | ICD-10-CM | POA: Insufficient documentation

## 2019-09-01 DIAGNOSIS — Z5321 Procedure and treatment not carried out due to patient leaving prior to being seen by health care provider: Secondary | ICD-10-CM | POA: Insufficient documentation

## 2019-09-01 DIAGNOSIS — R1012 Left upper quadrant pain: Secondary | ICD-10-CM | POA: Insufficient documentation

## 2019-09-01 LAB — URINALYSIS, ROUTINE W REFLEX MICROSCOPIC
Bilirubin Urine: NEGATIVE
Glucose, UA: NEGATIVE mg/dL
Hgb urine dipstick: NEGATIVE
Ketones, ur: NEGATIVE mg/dL
Leukocytes,Ua: NEGATIVE
Nitrite: NEGATIVE
Protein, ur: NEGATIVE mg/dL
Specific Gravity, Urine: 1.02 (ref 1.005–1.030)
pH: 8.5 — ABNORMAL HIGH (ref 5.0–8.0)

## 2019-09-01 LAB — PREGNANCY, URINE: Preg Test, Ur: NEGATIVE

## 2019-09-01 MED ORDER — SODIUM CHLORIDE 0.9% FLUSH
3.0000 mL | Freq: Once | INTRAVENOUS | Status: DC
  Filled 2019-09-01: qty 3

## 2019-09-01 NOTE — ED Triage Notes (Signed)
Pt c/o left side abd pain x 4-5 days-denies n/v/d-feels she is constipated-NAD-steady gait

## 2019-09-02 NOTE — Addendum Note (Signed)
Addended by: Neomia Dear on: 09/02/2019 03:53 PM   Modules accepted: Orders

## 2019-09-06 ENCOUNTER — Emergency Department (HOSPITAL_COMMUNITY)
Admission: EM | Admit: 2019-09-06 | Discharge: 2019-09-06 | Disposition: A | Discharge status: Home / Self Care | Payer: Self-pay | Attending: Emergency Medicine | Admitting: Emergency Medicine

## 2019-09-06 DIAGNOSIS — R6889 Other general symptoms and signs: Secondary | ICD-10-CM | POA: Insufficient documentation

## 2019-09-06 NOTE — ED Notes (Signed)
Pt informed registration that she is leaving and returned her pt labels.

## 2019-09-11 MED FILL — ALBUTEROL SULFATE HFA 108 (: 108 (90 BAS | 25 days supply | Qty: 18 | Fill #1

## 2019-09-11 MED FILL — ADVAIR 500/50 DISKUS: 500-50 | 30 days supply | Qty: 60 | Fill #1

## 2019-09-17 ENCOUNTER — Ambulatory Visit (INDEPENDENT_AMBULATORY_CARE_PROVIDER_SITE_OTHER): Payer: Self-pay | Admitting: Licensed Clinical Social Worker

## 2019-09-17 ENCOUNTER — Encounter: Payer: Self-pay | Admitting: Licensed Clinical Social Worker

## 2019-09-17 ENCOUNTER — Other Ambulatory Visit: Payer: Self-pay

## 2019-09-17 DIAGNOSIS — F320 Major depressive disorder, single episode, mild: Secondary | ICD-10-CM

## 2019-09-17 NOTE — BH Specialist Note (Signed)
Integrated Behavioral Health Visit via Telemedicine (Telephone)  09/17/2019 Shelby Pittman [id number]   Session Start time: 1;00  Session End time: 1:25 Total time: 25 minutes  Referring Provider: Dr. Barbaraann Faster Type of Visit: Telephonic Patient location: Home [facility] Provider location: Office All persons participating in visit: Patient and [facility]  Confirmed patient's address: Yes  Confirmed patient's phone number: Yes  Any changes to demographics: No   Discussed confidentiality: Yes    The following statements were read to the patient and/or legal guardian that are established with the [facility] Provider.  "The purpose of this phone visit is to provide behavioral health care while limiting exposure to the coronavirus (COVID19).  There is a possibility of technology failure and discussed alternative modes of communication if that failure occurs."  "By engaging in this telephone visit, you consent to the provision of healthcare.  Additionally, you authorize for your insurance to be billed for the services provided during this telephone visit."   Patient and/or legal guardian consented to telephone visit: Yes   PRESENTING CONCERNS: Patient and/or family reports the following symptoms/concerns: anxiety, depression, and stress. Duration of problem: increased over the past several months; Severity of problem: mild  GOALS ADDRESSED: Patient will: 1.  Reduce symptoms of: anxiety, depression and stress  2.  Increase knowledge and/or ability of: coping skills, healthy habits and stress reduction  3.  Demonstrate ability to: Increase healthy adjustment to current life circumstances and Increase adequate support systems for patient/family  INTERVENTIONS: Interventions utilized:  Motivational Interviewing, Brief CBT and Supportive Counseling Standardized Assessments completed: Not Needed  ASSESSMENT: Patient currently experiencing mild levels of anxiety and sadness. Patient processed  triggers in her relationship, and how that has lead to anxiety and insecurities. Patient processed ways that she can work on building herself back up. Patient is contemplating moving in with him. Patient identified the need for improved communication between her partner and herself.   Patient may benefit from counseling.  PLAN: 1. Follow up with behavioral health clinician on : three weeks.    Lysle Rubens, [facility], LCAS

## 2019-10-08 ENCOUNTER — Ambulatory Visit: Payer: Self-pay | Admitting: Licensed Clinical Social Worker

## 2019-10-09 ENCOUNTER — Ambulatory Visit: Payer: Self-pay

## 2019-10-12 ENCOUNTER — Ambulatory Visit: Payer: Self-pay | Attending: Internal Medicine

## 2019-10-14 ENCOUNTER — Other Ambulatory Visit: Payer: Self-pay

## 2019-10-14 ENCOUNTER — Ambulatory Visit (INDEPENDENT_AMBULATORY_CARE_PROVIDER_SITE_OTHER): Payer: Self-pay | Admitting: Licensed Clinical Social Worker

## 2019-10-14 ENCOUNTER — Encounter: Payer: Self-pay | Admitting: Licensed Clinical Social Worker

## 2019-10-14 DIAGNOSIS — F320 Major depressive disorder, single episode, mild: Secondary | ICD-10-CM

## 2019-10-14 NOTE — BH Specialist Note (Signed)
Integrated Behavioral Health Visit via Telemedicine (Telephone)  10/14/2019 Shelby Pittman [id number]   Session Start time: 1:10  Session End time: 1:30 Total time: 20 minutes  Referring Provider: Dr. Barbaraann Faster Type of Visit: Telephonic Patient location: Home [facility] Provider location: Office All persons participating in visit: Patient and [facility]  Confirmed patient's address: Yes  Confirmed patient's phone number: Yes  Any changes to demographics: No   Discussed confidentiality: Yes    The following statements were read to the patient and/or legal guardian that are established with the [facility] Provider.  "The purpose of this phone visit is to provide behavioral health care while limiting exposure to the coronavirus (COVID19).  There is a possibility of technology failure and discussed alternative modes of communication if that failure occurs."  "By engaging in this telephone visit, you consent to the provision of healthcare.  Additionally, you authorize for your insurance to be billed for the services provided during this telephone visit."   Patient and/or legal guardian consented to telephone visit: Yes   PRESENTING CONCERNS: Patient and/or family reports the following symptoms/concerns: anxiety, depression, and stress. Duration of problem: increased over the past several months; Severity of problem: mild   GOALS ADDRESSED: Patient will: 1.  Reduce symptoms of: anxiety, depression and stress  2.  Increase knowledge and/or ability of: coping skills, healthy habits and stress reduction  3.  Demonstrate ability to: Increase healthy adjustment to current life circumstances and Increase adequate support systems for patient/family  INTERVENTIONS: Interventions utilized:  Motivational Interviewing, Mindfulness or Relaxation Training, Brief CBT and Supportive Counseling Standardized Assessments completed: assessed for SI, HI, and self-harm.  ASSESSMENT: Patient currently  experiencing mild levels of depression, anxiety, and stress. Patient will be moving to [place] with her boyfriend. Patient reported a limited support system. Patient has to keep her feelings to herself due to a lack of understanding from her partner. Patient identified that she has to balance a healthy amount of sleep, and not getting too much as an unhealthy way of coping. Patient plans to implement some healthy coping skills into her life to reduce her need to external support.   Patient may benefit from counseling.  PLAN: 1. Follow up with behavioral health clinician on : prn Lysle Rubens, [facility], LCAS

## 2019-10-23 ENCOUNTER — Other Ambulatory Visit: Payer: Self-pay

## 2019-10-23 NOTE — Telephone Encounter (Signed)
Called pharmacy, spoke w/ Nita Sells, she will call pt's mother because they have no idea what she is talking about

## 2019-10-23 NOTE — Telephone Encounter (Signed)
The insurance company needs an attendings DEA/ NPI#, gave them dr butcher's

## 2019-10-23 NOTE — Telephone Encounter (Signed)
Pt's mother states the pharmacy is requesting NPI number for two inhalers. Requesting the nurse to call the pharmacy.

## 2019-10-27 ENCOUNTER — Other Ambulatory Visit: Payer: Self-pay | Admitting: Internal Medicine

## 2019-10-27 DIAGNOSIS — J4520 Mild intermittent asthma, uncomplicated: Secondary | ICD-10-CM

## 2019-10-27 DIAGNOSIS — J4540 Moderate persistent asthma, uncomplicated: Secondary | ICD-10-CM

## 2019-10-27 MED ORDER — FLUTICASONE-SALMETEROL 500-50 MCG/DOSE IN AEPB: 1.0000 | INHALATION_SPRAY | Freq: Two times a day (BID) | RESPIRATORY_TRACT | 0 refills | Status: DC

## 2019-10-27 MED ORDER — ALBUTEROL SULFATE HFA 108 (90 BASE) MCG/ACT IN AERS: 1.0000 | INHALATION_SPRAY | Freq: Four times a day (QID) | RESPIRATORY_TRACT | 0 refills | Status: DC | PRN

## 2019-10-27 NOTE — Telephone Encounter (Signed)
Refill Request Location Change.  Pt has moved to [place] and is requesting her Prescriptions be sent to   [facility] Address: [address] [place], [place], [place] [address] Phone: [phone]    ADVAIR DISKUS 500-50 MCG/DOSE AEPB    albuterol (VENTOLIN HFA) 108 (90 Base) MCG/ACT inhaler

## 2019-10-28 ENCOUNTER — Other Ambulatory Visit: Payer: Self-pay

## 2019-10-28 ENCOUNTER — Encounter: Payer: Self-pay | Admitting: Licensed Clinical Social Worker

## 2019-10-28 ENCOUNTER — Ambulatory Visit (INDEPENDENT_AMBULATORY_CARE_PROVIDER_SITE_OTHER): Payer: Self-pay | Admitting: Licensed Clinical Social Worker

## 2019-10-28 DIAGNOSIS — F320 Major depressive disorder, single episode, mild: Secondary | ICD-10-CM

## 2019-10-28 NOTE — BH Specialist Note (Signed)
Integrated Behavioral Health Visit via Telemedicine (Telephone)  10/28/2019 Shelby Pittman [id number]   Session Start time: 12;02  Session End time: 12:30 Total time: 28 minutes  Referring Provider: Dr. Barbaraann Faster Type of Visit: Telephonic Patient location: Home [facility] Provider location: Office All persons participating in visit: Patient and [facility]  Confirmed patient's address: Yes  Confirmed patient's phone number: Yes  Any changes to demographics: No   Discussed confidentiality: Yes    The following statements were read to the patient and/or legal guardian that are established with the [facility] Provider.  "The purpose of this phone visit is to provide behavioral health care while limiting exposure to the coronavirus (COVID19).  There is a possibility of technology failure and discussed alternative modes of communication if that failure occurs."  "By engaging in this telephone visit, you consent to the provision of healthcare.  Additionally, you authorize for your insurance to be billed for the services provided during this telephone visit."   Patient and/or legal guardian consented to telephone visit: Yes   PRESENTING CONCERNS: Patient and/or family reports the following symptoms/concerns: anxiety, depression, and stress. Duration of problem: increased over the past several months Severity of problem: mild  GOALS ADDRESSED: Patient will: 1.  Reduce symptoms of: anxiety, depression and stress  2.  Increase knowledge and/or ability of: coping skills, healthy habits and stress reduction  3.  Demonstrate ability to: Increase healthy adjustment to current life circumstances and Increase adequate support systems for patient/family  INTERVENTIONS: Interventions utilized:  Motivational Interviewing, Brief CBT, Supportive Counseling and Sleep Hygiene Standardized Assessments completed: Not Needed  ASSESSMENT: Patient currently experiencing mild levels of depression and anxiety.   Patient has moved to [place], and she is adjusting to the move. Patient reported more depressive symptoms in comparison to anxiety since the move. Patient reported improved communication with her partner, and that this is a goal that she wants to continue working on. Patient has improved her sleep hygiene, and as began exercising.   Patient may benefit from counseling.  PLAN: Follow up with behavioral health clinician on : three weeks. Lysle Rubens, [facility], LCAS

## 2019-11-03 ENCOUNTER — Other Ambulatory Visit: Payer: Self-pay

## 2019-11-03 ENCOUNTER — Telehealth: Payer: Self-pay | Admitting: Internal Medicine

## 2019-11-03 NOTE — Telephone Encounter (Signed)
Requesting to speak with a nurse about getting a refill on albuterol (VENTOLIN HFA) 108 (90 Base) MCG/ACT inhaler and Fluticasone-Salmeterol (ADVAIR DISKUS) 500-50 MCG/DOSE AEPB. Pt is using  [facility] - [place], [place] - [address] [phone] (Phone) [phone] (Fax)   Please call pt back, pt states she needs this med ASAP.

## 2019-11-03 NOTE — Telephone Encounter (Signed)
Called pt back, informed her that scripts were sent 3/1, she wanted me to verify address for store, I read the address and she hung up, tried to call back and got no answer

## 2019-11-05 NOTE — Telephone Encounter (Signed)
Received TC from patient, she states she has moved to [facility] and the RX for Advair was $475 which she cannot afford.  She is asking for something cheaper to be called in. This RN advised pt to call her insurance company/RX benefits to find out if there are any preferred medications on a different tier level than Advair which would be cheaper for the patient. Advised patient when she obtained this information to call [facility] back with preferred med list and we will forward request to Dr. Barbaraann Faster.  She verbalized understanding. SChaplin, RN,BSN

## 2019-11-06 NOTE — Telephone Encounter (Signed)
I've seen note.Thank you

## 2019-11-13 ENCOUNTER — Telehealth: Payer: Self-pay | Admitting: Licensed Clinical Social Worker

## 2019-11-13 NOTE — Telephone Encounter (Signed)
Patient was called to move appointment to a different time due to a mandatory training in our office. Patient agreed and will move to 2:45 on 3/23.

## 2019-11-18 ENCOUNTER — Telehealth: Payer: Self-pay | Admitting: Licensed Clinical Social Worker

## 2019-11-18 ENCOUNTER — Ambulatory Visit: Payer: Self-pay | Admitting: Licensed Clinical Social Worker

## 2019-11-18 NOTE — Telephone Encounter (Signed)
Patient was called for her scheduled appointment. Patient reported that she needed to reschedule. Patient will be R/S to 4/14.

## 2019-12-10 ENCOUNTER — Ambulatory Visit (INDEPENDENT_AMBULATORY_CARE_PROVIDER_SITE_OTHER): Payer: Self-pay | Admitting: Licensed Clinical Social Worker

## 2019-12-10 ENCOUNTER — Encounter: Payer: Self-pay | Admitting: Licensed Clinical Social Worker

## 2019-12-10 ENCOUNTER — Other Ambulatory Visit: Payer: Self-pay

## 2019-12-10 DIAGNOSIS — F320 Major depressive disorder, single episode, mild: Secondary | ICD-10-CM
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
## 2019-12-31 ENCOUNTER — Ambulatory Visit: Payer: Self-pay | Admitting: Licensed Clinical Social Worker

## 2020-01-27 ENCOUNTER — Ambulatory Visit: Payer: Self-pay | Admitting: Licensed Clinical Social Worker

## 2020-02-04 ENCOUNTER — Ambulatory Visit: Payer: Self-pay | Admitting: Licensed Clinical Social Worker

## 2020-02-04 ENCOUNTER — Telehealth: Payer: Self-pay | Admitting: Licensed Clinical Social Worker

## 2020-02-04 NOTE — Telephone Encounter (Signed)
Patient was called for her scheduled visit. Patient reported that she was doing well, and she did not need to keep her appointment.

## 2020-02-12 ENCOUNTER — Other Ambulatory Visit: Payer: Self-pay | Admitting: Internal Medicine

## 2020-02-12 DIAGNOSIS — J4520 Mild intermittent asthma, uncomplicated: Secondary | ICD-10-CM

## 2020-02-13 ENCOUNTER — Telehealth: Payer: Self-pay | Admitting: *Deleted

## 2020-02-13 NOTE — Telephone Encounter (Signed)
Pt informed albuterol has been sent

## 2020-04-13 ENCOUNTER — Other Ambulatory Visit: Payer: Self-pay | Admitting: Internal Medicine

## 2020-04-13 DIAGNOSIS — J4520 Mild intermittent asthma, uncomplicated: Secondary | ICD-10-CM

## 2020-05-15 ENCOUNTER — Encounter (HOSPITAL_BASED_OUTPATIENT_CLINIC_OR_DEPARTMENT_OTHER): Payer: Self-pay | Admitting: Emergency Medicine

## 2020-05-15 ENCOUNTER — Emergency Department (HOSPITAL_BASED_OUTPATIENT_CLINIC_OR_DEPARTMENT_OTHER)
Admission: EM | Admit: 2020-05-15 | Discharge: 2020-05-15 | Disposition: A | Discharge status: Home / Self Care | Payer: PRIVATE HEALTH INSURANCE | Attending: Emergency Medicine | Admitting: Emergency Medicine

## 2020-05-15 ENCOUNTER — Other Ambulatory Visit: Payer: Self-pay

## 2020-05-15 DIAGNOSIS — J45909 Unspecified asthma, uncomplicated: Secondary | ICD-10-CM | POA: Insufficient documentation

## 2020-05-15 DIAGNOSIS — Z79899 Other long term (current) drug therapy: Secondary | ICD-10-CM | POA: Insufficient documentation

## 2020-05-15 DIAGNOSIS — M545 Low back pain, unspecified: Secondary | ICD-10-CM

## 2020-05-15 DIAGNOSIS — N3000 Acute cystitis without hematuria: Secondary | ICD-10-CM | POA: Insufficient documentation

## 2020-05-15 DIAGNOSIS — M7918 Myalgia, other site: Secondary | ICD-10-CM | POA: Insufficient documentation

## 2020-05-15 LAB — URINALYSIS, ROUTINE W REFLEX MICROSCOPIC
Bilirubin Urine: NEGATIVE
Glucose, UA: NEGATIVE mg/dL
Ketones, ur: NEGATIVE mg/dL
Nitrite: NEGATIVE
Protein, ur: NEGATIVE mg/dL
Specific Gravity, Urine: 1.02 (ref 1.005–1.030)
pH: 6.5 (ref 5.0–8.0)

## 2020-05-15 LAB — URINALYSIS, MICROSCOPIC (REFLEX)

## 2020-05-15 LAB — PREGNANCY, URINE: Preg Test, Ur: NEGATIVE

## 2020-05-15 MED ORDER — MELOXICAM 7.5 MG PO TABS: 15.0000 mg | ORAL_TABLET | Freq: Every day | ORAL | 0 refills | Status: AC

## 2020-05-15 MED ORDER — NITROFURANTOIN MONOHYD MACRO 100 MG PO CAPS: 100.0000 mg | ORAL_CAPSULE | Freq: Two times a day (BID) | ORAL | 0 refills | Status: DC

## 2020-05-15 MED ORDER — METHOCARBAMOL 500 MG PO TABS: 1000.0000 mg | ORAL_TABLET | Freq: Four times a day (QID) | ORAL | 0 refills | Status: DC
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
## 2020-05-15 NOTE — ED Triage Notes (Addendum)
Low back pain for several days. No known injury. She states the pain comes around to her abd sometimes.

## 2020-05-15 NOTE — Discharge Instructions (Signed)
Please read and follow all provided instructions.  Your diagnoses today include:  1. Acute bilateral low back pain without sciatica   2. Acute cystitis without hematuria     Tests performed today include:  Vital signs - see below for your results today  Medications prescribed:   Macrobid - antibiotic for urinary tract infection  You have been prescribed an antibiotic medicine: take the entire course of medicine even if you are feeling better. Stopping early can cause the antibiotic not to work.   Robaxin (methocarbamol) - muscle relaxer medication  DO NOT drive or perform any activities that require you to be awake and alert because this medicine can make you drowsy.    Meloxicam - anti-inflammatory pain medication  You have been prescribed an anti-inflammatory medication or NSAID. Take with food. Do not take aspirin, ibuprofen, or naproxen if taking this medication. Take smallest effective dose for the shortest duration needed for your pain. Stop taking if you experience stomach pain or vomiting.   Take any prescribed medications only as directed.  Home care instructions:   Follow any educational materials contained in this packet  Please rest, use ice or heat on your back for the next several days  Do not lift, push, pull anything more than 10 pounds for the next week  Follow-up instructions: Please follow-up with your primary care provider in the next 1 week for further evaluation of your symptoms.   Return instructions:  SEEK IMMEDIATE MEDICAL ATTENTION IF YOU HAVE:  New numbness, tingling, weakness, or problem with the use of your arms or legs  Severe back pain not relieved with medications  Loss control of your bowels or bladder  Increasing pain in any areas of the body (such as chest or abdominal pain)  Shortness of breath, dizziness, or fainting.   Worsening nausea (feeling sick to your stomach), vomiting, fever, or sweats  Any other emergent concerns  regarding your health   Additional Information:  Your vital signs today were: BP (!) 132/91 (BP Location: Left Arm)   Pulse 79   Temp 99.5 F (37.5 C) (Oral)   Resp 18   Ht 4\' 11"  (1.499 m)   Wt 72.6 kg   LMP 05/05/2020   SpO2 99%   BMI 32.32 kg/m  If your blood pressure (BP) was elevated above 135/85 this visit, please have this repeated by your doctor within one month. --------------

## 2020-05-17 ENCOUNTER — Other Ambulatory Visit: Payer: Self-pay

## 2020-05-17 ENCOUNTER — Encounter: Payer: Self-pay | Admitting: Internal Medicine

## 2020-05-17 ENCOUNTER — Ambulatory Visit (INDEPENDENT_AMBULATORY_CARE_PROVIDER_SITE_OTHER): Payer: PRIVATE HEALTH INSURANCE | Admitting: Internal Medicine

## 2020-05-17 DIAGNOSIS — M545 Low back pain, unspecified: Secondary | ICD-10-CM

## 2020-05-17 DIAGNOSIS — M549 Dorsalgia, unspecified: Secondary | ICD-10-CM | POA: Insufficient documentation

## 2020-05-17 HISTORY — DX: Dorsalgia, unspecified: M54.9

## 2020-05-17 MED ORDER — METHOCARBAMOL 500 MG PO TABS: 1500.0000 mg | ORAL_TABLET | Freq: Three times a day (TID) | ORAL | 0 refills | Status: AC | PRN

## 2020-05-17 NOTE — Assessment & Plan Note (Addendum)
The appt is an ED follow up for back pain. She was seen on 9/18 in the ED for this at which time she was given robaxin and mobic and told to follow up with PCP. She was also given a script for macrobid at that time for a dirty UA. She continues to have back pain today. There are no red flag symptoms. I suspect that it may be attributable to sitting in front of a computer for the past month, resulting in muscle strain. She is also overweight which may contribute to long term back pain. On exam she has tenderness to palpation over the lumbosacral paraspinal muscles but no neuropathic pain. I did consider a renal etiology however symptoms and exam are more consistent with MSK. She denies systemic sx at home and remains afebrile here today and appears well overall. I think it is ok to complete her macrobid since she is already a few days in anyways. No indication for imaging at this time.  Plan --refilled robaxin --continue mobic --education provided for back stretching, rest, relax, heat/ice --work note provided for 7d --f/u in 2 weeks if no improvement --return precautions discussed

## 2020-05-17 NOTE — Patient Instructions (Addendum)
Continue to work on stretching your back. I have sent in a refill in on your muscle relaxer.   Acute Back Pain, Adult Acute back pain is sudden and usually short-lived. It is often caused by an injury to the muscles and tissues in the back. The injury may result from:  A muscle or ligament getting overstretched or torn (strained). Ligaments are tissues that connect bones to each other. Lifting something improperly can cause a back strain.  Wear and tear (degeneration) of the spinal disks. Spinal disks are circular tissue that provides cushioning between the bones of the spine (vertebrae).  Twisting motions, such as while playing sports or doing yard work.  A hit to the back.  Arthritis. You may have a physical exam, lab tests, and imaging tests to find the cause of your pain. Acute back pain usually goes away with rest and home care. Follow these instructions at home: Managing pain, stiffness, and swelling  Take over-the-counter and prescription medicines only as told by your health care provider.  Your health care provider may recommend applying ice during the first 24-48 hours after your pain starts. To do this: ? Put ice in a plastic bag. ? Place a towel between your skin and the bag. ? Leave the ice on for 20 minutes, 2-3 times a day.  If directed, apply heat to the affected area as often as told by your health care provider. Use the heat source that your health care provider recommends, such as a moist heat pack or a heating pad. ? Place a towel between your skin and the heat source. ? Leave the heat on for 20-30 minutes. ? Remove the heat if your skin turns bright red. This is especially important if you are unable to feel pain, heat, or cold. You have a greater risk of getting burned. Activity   Do not stay in bed. Staying in bed for more than 1-2 days can delay your recovery.  Sit up and stand up straight. Avoid leaning forward when you sit, or hunching over when you  stand. ? If you work at a desk, sit close to it so you do not need to lean over. Keep your chin tucked in. Keep your neck drawn back, and keep your elbows bent at a right angle. Your arms should look like the letter "L." ? Sit high and close to the steering wheel when you drive. Add lower back (lumbar) support to your car seat, if needed.  Take short walks on even surfaces as soon as you are able. Try to increase the length of time you walk each day.  Do not sit, drive, or stand in one place for more than 30 minutes at a time. Sitting or standing for long periods of time can put stress on your back.  Do not drive or use heavy machinery while taking prescription pain medicine.  Use proper lifting techniques. When you bend and lift, use positions that put less stress on your back: ? [place] your knees. ? Keep the load close to your body. ? Avoid twisting.  Exercise regularly as told by your health care provider. Exercising helps your back heal faster and helps prevent back injuries by keeping muscles strong and flexible.  Work with a physical therapist to make a safe exercise program, as recommended by your health care provider. Do any exercises as told by your physical therapist. Lifestyle  Maintain a healthy weight. Extra weight puts stress on your back and makes it difficult to  have good posture.  Avoid activities or situations that make you feel anxious or stressed. Stress and anxiety increase muscle tension and can make back pain worse. Learn ways to manage anxiety and stress, such as through exercise. General instructions  Sleep on a firm mattress in a comfortable position. Try lying on your side with your knees slightly bent. If you lie on your back, put a pillow under your knees.  Follow your treatment plan as told by your health care provider. This may include: ? Cognitive or behavioral therapy. ? Acupuncture or massage therapy. ? Meditation or yoga. Contact a health care provider  if:  You have pain that is not relieved with rest or medicine.  You have increasing pain going down into your legs or buttocks.  Your pain does not improve after 2 weeks.  You have pain at night.  You lose weight without trying.  You have a fever or chills. Get help right away if:  You develop new bowel or bladder control problems.  You have unusual weakness or numbness in your arms or legs.  You develop nausea or vomiting.  You develop abdominal pain.  You feel faint. Summary  Acute back pain is sudden and usually short-lived.  Use proper lifting techniques. When you bend and lift, use positions that put less stress on your back.  Take over-the-counter and prescription medicines and apply heat or ice as directed by your health care provider. This information is not intended to replace advice given to you by your health care provider. Make sure you discuss any questions you have with your health care provider. Document Revised: 12/03/2018 Document Reviewed: 03/28/2017 Elsevier Patient Education  2020 [employer]  Back Exercises The following exercises strengthen the muscles that help to support the trunk and back. They also help to keep the lower back flexible. Doing these exercises can help to prevent back pain or lessen existing pain.  If you have back pain or discomfort, try doing these exercises 2-3 times each day or as told by your health care provider.  As your pain improves, do them once each day, but increase the number of times that you repeat the steps for each exercise (do more repetitions).  To prevent the recurrence of back pain, continue to do these exercises once each day or as told by your health care provider. Do exercises exactly as told by your health care provider and adjust them as directed. It is normal to feel mild stretching, pulling, tightness, or discomfort as you do these exercises, but you should stop right away if you feel sudden pain or your  pain gets worse. Exercises Single knee to chest Repeat these steps 3-5 times for each leg: 1. Lie on your back on a firm bed or the floor with your legs extended. 2. Bring one knee to your chest. Your other leg should stay extended and in contact with the floor. 3. Hold your knee in place by grabbing your knee or thigh with both hands and hold. 4. Pull on your knee until you feel a gentle stretch in your lower back or buttocks. 5. Hold the stretch for 10-30 seconds. 6. Slowly release and straighten your leg. Pelvic tilt Repeat these steps 5-10 times: 1. Lie on your back on a firm bed or the floor with your legs extended. 2. Bend your knees so they are pointing toward the ceiling and your feet are flat on the floor. 3. Tighten your lower abdominal muscles to press your lower  back against the floor. This motion will tilt your pelvis so your tailbone points up toward the ceiling instead of pointing to your feet or the floor. 4. With gentle tension and even breathing, hold this position for 5-10 seconds. Cat-cow Repeat these steps until your lower back becomes more flexible: 1. Get into a hands-and-knees position on a firm surface. Keep your hands under your shoulders, and keep your knees under your hips. You may place padding under your knees for comfort. 2. Let your head hang down toward your chest. Contract your abdominal muscles and point your tailbone toward the floor so your lower back becomes rounded like the back of a cat. 3. Hold this position for 5 seconds. 4. Slowly lift your head, let your abdominal muscles relax and point your tailbone up toward the ceiling so your back forms a sagging arch like the back of a cow. 5. Hold this position for 5 seconds.  Press-ups Repeat these steps 5-10 times: 1. Lie on your abdomen (face-down) on the floor. 2. Place your palms near your head, about shoulder-width apart. 3. Keeping your back as relaxed as possible and keeping your hips on the  floor, slowly straighten your arms to raise the top half of your body and lift your shoulders. Do not use your back muscles to raise your upper torso. You may adjust the placement of your hands to make yourself more comfortable. 4. Hold this position for 5 seconds while you keep your back relaxed. 5. Slowly return to lying flat on the floor.  Bridges Repeat these steps 10 times: 1. Lie on your back on a firm surface. 2. Bend your knees so they are pointing toward the ceiling and your feet are flat on the floor. Your arms should be flat at your sides, next to your body. 3. Tighten your buttocks muscles and lift your buttocks off the floor until your waist is at almost the same height as your knees. You should feel the muscles working in your buttocks and the back of your thighs. If you do not feel these muscles, slide your feet 1-2 inches farther away from your buttocks. 4. Hold this position for 3-5 seconds. 5. Slowly lower your hips to the starting position, and allow your buttocks muscles to relax completely. If this exercise is too easy, try doing it with your arms crossed over your chest. Abdominal crunches Repeat these steps 5-10 times: 1. Lie on your back on a firm bed or the floor with your legs extended. 2. Bend your knees so they are pointing toward the ceiling and your feet are flat on the floor. 3. Cross your arms over your chest. 4. Tip your chin slightly toward your chest without bending your neck. 5. Tighten your abdominal muscles and slowly raise your trunk (torso) high enough to lift your shoulder blades a tiny bit off the floor. Avoid raising your torso higher than that because it can put too much stress on your low back and does not help to strengthen your abdominal muscles. 6. Slowly return to your starting position. Back lifts Repeat these steps 5-10 times: 1. Lie on your abdomen (face-down) with your arms at your sides, and rest your forehead on the floor. 2. Tighten the  muscles in your legs and your buttocks. 3. Slowly lift your chest off the floor while you keep your hips pressed to the floor. Keep the back of your head in line with the curve in your back. Your eyes should be looking at  the floor. 4. Hold this position for 3-5 seconds. 5. Slowly return to your starting position. Contact a health care provider if:  Your back pain or discomfort gets much worse when you do an exercise.  Your worsening back pain or discomfort does not lessen within 2 hours after you exercise. If you have any of these problems, stop doing these exercises right away. Do not do them again unless your health care provider says that you can. Get help right away if:  You develop sudden, severe back pain. If this happens, stop doing the exercises right away. Do not do them again unless your health care provider says that you can. This information is not intended to replace advice given to you by your health care provider. Make sure you discuss any questions you have with your health care provider. Document Revised: 12/19/2018 Document Reviewed: 05/16/2018 Elsevier Patient Education  2020 [employer].
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
## 2020-05-20 NOTE — Progress Notes (Signed)
Internal Medicine Clinic Attending  Case discussed with Dr. Christian  At the time of the visit.  We reviewed the resident's history and exam and pertinent patient test results.  I agree with the assessment, diagnosis, and plan of care documented in the resident's note.  

## 2020-06-28 ENCOUNTER — Other Ambulatory Visit: Payer: Self-pay

## 2020-06-28 ENCOUNTER — Ambulatory Visit (INDEPENDENT_AMBULATORY_CARE_PROVIDER_SITE_OTHER): Payer: PRIVATE HEALTH INSURANCE | Admitting: Internal Medicine

## 2020-06-28 ENCOUNTER — Telehealth: Payer: Self-pay | Admitting: *Deleted

## 2020-06-28 DIAGNOSIS — J4540 Moderate persistent asthma, uncomplicated: Secondary | ICD-10-CM

## 2020-06-28 NOTE — Assessment & Plan Note (Signed)
Patient evaluated via telephone visit for cough , congestion , and SOB. She has Asthma. Her symptoms began Friday and says it feels like a sinus infection. The cough began yesterday and is non productive. She has been using her rescue inhaler every 3-4 hours. She denies any changes to her environment. Patient denies fever. She is unvaccinated for Covid-19, but works from home. Her grandmother and mother both are vaccinated.   Assessment: Asthma exacerbation is most likely. [facility] [place] [facility] and will [facility] get [facility] tonight. I will follow up in the morning and if negative start treatment for asthma exacerbation.Patient given instructions if her symptoms worsen or new symptoms arise, she should go to urgent care or ER.  Plan: -Follow up results of Covid test - If Covid negative , start prednisone 40 mg x 5 days

## 2020-06-28 NOTE — Telephone Encounter (Signed)
Pt calls very congested, wheezing noted as she speaks. Denies exposure to COVID She states she really doesn't think she has COVID Spoke w/ dr Barbaraann Faster, he will call pt this pm Spoke w/ doris s. She will add pt as telehealth for this pm

## 2020-06-28 NOTE — Progress Notes (Signed)
  [facility] Internal Medicine Residency Telephone Encounter Continuity Care Appointment  HPI:   This telephone encounter was created for [name] on 06/28/2020 for the following purpose/cc cough and congestion.   Past Medical History:  Past Medical History:  Diagnosis Date  . Asthma   . Migraine       ROS:   Negative for runny nose, fever,changes in bowel movement and dysuria. Positive for cough , congestion, and SOB.    Assessment / Plan / Recommendations:   Please see A&P under problem oriented charting for assessment of the patient's acute and chronic medical conditions.   As always, pt is advised that if symptoms worsen or new symptoms arise, they should go to an urgent care facility or to to ER for further evaluation.   Consent and Medical Decision Making:   Patient discussed with Dr. Cleda Daub  This is a telephone encounter between Shelby S Lupi and Milus Banister on 06/28/2020 for cough and congestion. The visit was conducted with the patient located at home and Milus Banister at [facility]. The patient's identity was confirmed using their DOB and current address. The patient has consented to being evaluated through a telephone encounter and understands the associated risks (an examination cannot be done and the patient may need to come in for an appointment) / benefits (allows the patient to remain at home, decreasing exposure to coronavirus). I personally spent 21 minutes on medical discussion.

## 2020-06-29 ENCOUNTER — Encounter: Payer: Self-pay | Admitting: Internal Medicine

## 2020-06-29 ENCOUNTER — Telehealth: Payer: Self-pay | Admitting: Internal Medicine

## 2020-06-29 ENCOUNTER — Telehealth: Payer: Self-pay

## 2020-06-29 NOTE — Telephone Encounter (Signed)
Received TC from patient, she states she was not able to get a rapid Covid test because [facility].  Patient did [facility] a [facility] and was told she would have the results in 24-48 hours.   She is requesting a work excuse for today and tomorrow. RN gave patient information to sign up for MyChart and encouraged that she do this.   Pt had Telehealth visit yesterday. Will forward to PCP. SChaplin, RN,BSN

## 2020-06-29 NOTE — Telephone Encounter (Signed)
Excuse sent for 11/1-11/3

## 2020-06-29 NOTE — Telephone Encounter (Signed)
I called Shelby Pittman this morning and she was unable to get a Covid test yesterday afternoon.  [facility] this [facility].  She will internal medicine clinic and share results with me when they return.  Pending test results we will treat as outlined in note yesterday.

## 2020-06-30 ENCOUNTER — Telehealth: Payer: Self-pay

## 2020-06-30 DIAGNOSIS — J4520 Mild intermittent asthma, uncomplicated: Secondary | ICD-10-CM

## 2020-06-30 MED ORDER — MONTELUKAST SODIUM 10 MG PO TABS: 10.0000 mg | ORAL_TABLET | Freq: Every day | ORAL | 3 refills | Status: DC

## 2020-06-30 MED ORDER — PREDNISONE 20 MG PO TABS: 40.0000 mg | ORAL_TABLET | Freq: Every day | ORAL | 0 refills | Status: AC

## 2020-06-30 MED ORDER — ALBUTEROL SULFATE HFA 108 (90 BASE) MCG/ACT IN AERS: 1.0000 | INHALATION_SPRAY | RESPIRATORY_TRACT | 1 refills | Status: DC | PRN

## 2020-06-30 NOTE — Telephone Encounter (Signed)
Pt is calling regarding covid results [phone]

## 2020-06-30 NOTE — Telephone Encounter (Signed)
Patient returned call. States her Covid test was negative. Would like a call back from PCP to discuss next steps as she still has a lot of chest congestion. Kinnie Feil, BSN, RN-BC

## 2020-06-30 NOTE — Telephone Encounter (Signed)
Patient Covid negative and continues to be symptomatic.  Will treat as asthma exacerbation She has been off of her Singulair and will refill this medication.  Prednisone 40 mg x 5 days.  Albuterol as needed.  If not improving patient given precautions to seek appropriate care.

## 2020-06-30 NOTE — Telephone Encounter (Signed)
Returned call to patient. Wanted to let Provider know that results of [facility] are not [facility] yet. States they should be back today and she will let us know what the result is as soon as it's posted. Kinnie Feil, BSN, RN-BC

## 2020-07-02 NOTE — Progress Notes (Signed)
Internal Medicine Clinic Attending  Case discussed with Dr. Steen  At the time of the visit.  We reviewed the resident's history and exam and pertinent patient test results.  I agree with the assessment, diagnosis, and plan of care documented in the resident's note.  

## 2020-08-23 ENCOUNTER — Other Ambulatory Visit: Payer: Self-pay | Admitting: *Deleted

## 2020-08-23 DIAGNOSIS — J4520 Mild intermittent asthma, uncomplicated: Secondary | ICD-10-CM

## 2020-08-30 ENCOUNTER — Other Ambulatory Visit: Payer: Self-pay | Admitting: Student

## 2020-08-30 DIAGNOSIS — J4520 Mild intermittent asthma, uncomplicated: Secondary | ICD-10-CM

## 2020-08-30 MED ORDER — ALBUTEROL SULFATE HFA 108 (90 BASE) MCG/ACT IN AERS: 1.0000 | INHALATION_SPRAY | RESPIRATORY_TRACT | 5 refills | Status: DC | PRN

## 2020-08-30 MED FILL — PROAIR HFA 90 MCG INHALER: 108 (90 BAS | 30 days supply | Qty: 9 | Fill #0

## 2020-09-03 ENCOUNTER — Telehealth: Payer: Self-pay | Admitting: Internal Medicine

## 2020-09-03 NOTE — Telephone Encounter (Signed)
RTC, patient states she is having chills, very tired and achy w/ onset last night.  Symptoms continue this morning along with diarrhea, and h/a.  She denies any SOB or chest pain.  She states she tried to work, but was feeling too bad to continue working.  She has not received any Covid vaccines. RN advised patient not to work and to get tested for [employer].  Testing site number and website given to patient and she agree's to schedule test.  Advised to drink plenty of fluids, rest, and isolate from everyone until results are back/negative.  CDC guidelines reviewed with patient. Patient states she will call [facility] on Monday and notify us of test results.  RN instructed patient if she develops and difficulty breathing/SOB or chest pain to seek emergency care at ED and she verbalized understanding. SChaplin, RN,BSN

## 2020-09-03 NOTE — Telephone Encounter (Signed)
Patient requesting call back.  Having possible covid symptoms.  Started last night diarrhea, congestion, and chills.

## 2020-09-06 ENCOUNTER — Telehealth: Payer: Self-pay

## 2020-09-06 ENCOUNTER — Encounter: Payer: Self-pay | Admitting: Internal Medicine

## 2020-09-06 NOTE — Telephone Encounter (Signed)
I spoke with patient last week, she will need a work excuse.  Would you like her to have a telehealth visit this week to have a work excuse provided? Thanks, SChaplin, RN,BSN

## 2020-09-06 NOTE — Telephone Encounter (Signed)
No, we can provide a work excuse. We need a copy of her covid test, though.

## 2020-09-06 NOTE — Telephone Encounter (Signed)
Pt want to notified the doctor she tested positive for COVID.

## 2020-09-06 NOTE — Telephone Encounter (Signed)
TC to patient, isolation guidelines for positive, symptomatic Covid patients reviewed and she verbalized understanding.  She denies and SOB or chest pain, only c/o muscle aches and  stuffy nose and advised to use saline spray.  She is requesting note/letter that she was advised to isolate and not work.  Informed [facility] will need copy of her Covid test results.  She states she will submit this via mychart.   SChaplin, RN,BSN

## 2020-09-07 NOTE — Telephone Encounter (Signed)
Agree with plan. Received her test results. Note provided to triage for her.

## 2020-09-08 NOTE — Telephone Encounter (Signed)
Front office stated pt's mother will pick up letter. Letter placed  at the front desk.

## 2020-10-05 ENCOUNTER — Other Ambulatory Visit: Payer: Self-pay | Admitting: Internal Medicine

## 2020-10-05 DIAGNOSIS — J4520 Mild intermittent asthma, uncomplicated: Secondary | ICD-10-CM

## 2020-10-05 MED ORDER — ALBUTEROL SULFATE HFA 108 (90 BASE) MCG/ACT IN AERS: 1.0000 | INHALATION_SPRAY | RESPIRATORY_TRACT | 5 refills | Status: DC | PRN

## 2020-10-05 NOTE — Telephone Encounter (Signed)
Refill Request- Pt states she has moved again and would like to use  The following pharmacy:  Pharmacy at [facility] [phone]  [address] [place], [place] [address] Get [phone]  albuterol (VENTOLIN HFA) 108 (90 Base) MCG/ACT inhaler

## 2020-10-06 ENCOUNTER — Telehealth: Payer: Self-pay

## 2020-10-06 DIAGNOSIS — J4520 Mild intermittent asthma, uncomplicated: Secondary | ICD-10-CM

## 2020-10-06 NOTE — Telephone Encounter (Signed)
Can you discuss with patient. I am not sure if there is anything additional that we can do to help her. I don't know of a way to lower cost of albuterol.

## 2020-10-06 NOTE — Telephone Encounter (Signed)
Return pt's call - she states Albuterol inhaler cost $75; previously it was $34. She has [employer] insurance.she' asking why so expensive; I asked her to call her insurance co. Also I will ask Rachelle,pharmacist, to f/u.

## 2020-10-06 NOTE — Telephone Encounter (Signed)
I may ask you to reach out to pharmacy to discuss. However, I will see if patient contacts insurance first and reports answer

## 2020-10-06 NOTE — Telephone Encounter (Signed)
Can you call the patient with the information you have ? Thanks

## 2020-10-06 NOTE — Telephone Encounter (Signed)
Ok sounds good. Just let me know

## 2020-10-06 NOTE — Telephone Encounter (Signed)
Spoke to patient about her generic albuterol inhaler cost. Her copay is $35 at the pharmacy & $28 through [facility] pharmacy. She is getting one mailed to her through there... She currently lives in [place] and has not found a new primary doctor. I emailed her a Radio producer to try and get her approved for Proair in the meantime. Will follow up with her on Friday.

## 2020-10-06 NOTE — Telephone Encounter (Signed)
Wonderful! Thank you so much Durward Mallard!

## 2020-10-06 NOTE — Telephone Encounter (Signed)
Pls is having trouble getting her medicine too expensive, she pls contact [phone]

## 2020-10-06 NOTE — Telephone Encounter (Signed)
Talked to pt - stated her insurance co told to call her doctor's office to switch to something cheaper or free.

## 2020-10-06 NOTE — Telephone Encounter (Signed)
Attempted to call pharmacy [facility] in [place]) but they stated patient had rx transferred to mail order pharmacy. I am not able to tell why it is more expensive. She will have to call pharmacy and discuss. There is likely not anything cheaper than albuterol and I am not sure why insurance could not provide her better info.

## 2020-10-13 MED ORDER — ALBUTEROL SULFATE HFA 108 (90 BASE) MCG/ACT IN AERS: 1.0000 | INHALATION_SPRAY | RESPIRATORY_TRACT | 5 refills | Status: DC | PRN

## 2020-10-13 NOTE — Addendum Note (Signed)
Addended by: Skip Estimable on: 10/13/2020 04:20 PM   Modules accepted: Orders

## 2020-10-13 NOTE — Telephone Encounter (Signed)
Refill sent.

## 2020-10-13 NOTE — Telephone Encounter (Signed)
Received TC from patient who states she has contacted her mail off pharmacy and was told the pharmacy was waiting for a RX from MD's office for albuterol.  She is requesting RX be sent to [facility] pharmacy.  Pt lives in [place] and [facility] pharmacy tech has already reached out to her. Will forward to PCP. SChaplin, RN,BSN

## 2020-10-13 NOTE — Addendum Note (Signed)
Addended by: Gardenia Phlegm on: 10/13/2020 04:53 PM   Modules accepted: Orders

## 2020-10-26 ENCOUNTER — Telehealth: Payer: Self-pay

## 2020-10-26 NOTE — Telephone Encounter (Signed)
Im sorry, I am not familiar with any coupons... but yes, I have been working with her for a couple weeks trying to get her approved with [employer]. Not sure what to do about samples being [place].

## 2020-10-26 NOTE — Telephone Encounter (Signed)
Received TC from patient who now lives in [place].  She is upset because her albuterol inhaler is very expensive.  She states she has a Advertising account executive for an albuterol inhaler RX available, on hold, at the [facility] [address] in [place], but she can't afford the RX.    RN informed patient that the cost of her RX is between her and her insurance Company.  There is a prior message from [facility] pharmacy Tech regarding [employer] application for ProAir.  Pt states she is currently messaging [facility] pharm tech regarding application, but needs albuterol HFA now.    Will forward to Pharm team to see if they can reach out to patient to assist with coupon, sample etc. Pt states her new phone number is [phone], updated in chart. Thank you, SChaplin, RN,BSN

## 2020-10-26 NOTE — Telephone Encounter (Signed)
I was looking back at previous notes and it looks like Shelby Pittman spoke to her and cost was $28. This is likely as low as it will be on her insurance. If she wants to know reason for cost she needs to call insurance but likely there is not a free inhaler option but her insurance may be able to provider her better info. Otherwise we will just be doing trial and error of sending in prescriptions. She does have the option to utilize goodrx. I am not sure the pharmacies located near her but she can go to goodrx and look up options. I did a quick search here and there are few options that are $19 if she is able to afford that. If she chooses to do that she will need to call pharmacy and have rx transferred.

## 2021-01-18 ENCOUNTER — Other Ambulatory Visit: Payer: Self-pay

## 2021-01-18 ENCOUNTER — Telehealth: Payer: Self-pay

## 2021-01-18 DIAGNOSIS — J4520 Mild intermittent asthma, uncomplicated: Secondary | ICD-10-CM

## 2021-01-18 MED ORDER — ALBUTEROL SULFATE HFA 108 (90 BASE) MCG/ACT IN AERS: 1.0000 | INHALATION_SPRAY | RESPIRATORY_TRACT | 5 refills | Status: DC | PRN

## 2021-01-18 NOTE — Telephone Encounter (Signed)
Shelby Pittman with [facility] requesting refill on  albuterol (VENTOLIN HFA) 108 (90 Base) MCG/ACT inhaler.

## 2021-01-18 NOTE — Telephone Encounter (Signed)
Pt mom called stated that pt went to the pharmacy to collect her inhaler but it was the wrong one that was sent in .Marland Kitchen..   pt is needing her albuterol (VENTOLIN HFA) 108 (90 Base) MCG/ACT inhaler sent to  [facility] [address], [place] [address] Phone:  [phone]  Fax:  [phone]

## 2021-03-01 ENCOUNTER — Encounter: Payer: Self-pay | Admitting: *Deleted

## 2021-05-06 ENCOUNTER — Telehealth: Payer: Self-pay

## 2021-05-06 ENCOUNTER — Other Ambulatory Visit: Payer: Self-pay | Admitting: Student

## 2021-05-06 DIAGNOSIS — J4520 Mild intermittent asthma, uncomplicated: Secondary | ICD-10-CM

## 2021-05-06 NOTE — Telephone Encounter (Signed)
Pt is requesting a call back .. she was at first calling for her asthma inhaler  pt stated that she is having trouble getting her breath and it is getting hard for her to breath

## 2021-05-06 NOTE — Telephone Encounter (Signed)
Pt is calling about her albuterol inhaler also .. she stated that she is having a hard time breathing and to catch her breath

## 2021-05-06 NOTE — Telephone Encounter (Signed)
Returned call to patient. Last in-person OV at [facility] was 05/17/2020 with 2 week return request. She has relocated to [facility] but can still come to [facility] when she visits her mom here. Next time for visit will be November. She is advised to call back in October to schedule appt. States she found a PCP in [place] but was not happy with him and will be looking for another PCP. She used her last dose of albuterol this AM and is requesting refill.  States when she woke this AM, she was having difficulty catching her breath but feels better now. She continues to take Advair and singulair as instructed. She is advised to present to UC or ED if she has any more difficulty or if sx are not relieved with albuterol. She states understanding.

## 2021-10-05 ENCOUNTER — Other Ambulatory Visit: Payer: Self-pay

## 2021-10-05 ENCOUNTER — Ambulatory Visit (INDEPENDENT_AMBULATORY_CARE_PROVIDER_SITE_OTHER): Payer: PRIVATE HEALTH INSURANCE | Admitting: Student

## 2021-10-05 ENCOUNTER — Encounter: Payer: Self-pay | Admitting: Student

## 2021-10-05 VITALS — BP 110/71 | HR 71 | Temp 98.9°F | Resp 28 | Ht 59.0 in | Wt 151.8 lb

## 2021-10-05 DIAGNOSIS — J4520 Mild intermittent asthma, uncomplicated: Secondary | ICD-10-CM | POA: Diagnosis not present

## 2021-10-05 DIAGNOSIS — N979 Female infertility, unspecified: Secondary | ICD-10-CM

## 2021-10-05 DIAGNOSIS — Z23 Encounter for immunization: Secondary | ICD-10-CM | POA: Diagnosis not present

## 2021-10-05 DIAGNOSIS — Z0000 Encounter for general adult medical examination without abnormal findings: Secondary | ICD-10-CM

## 2021-10-05 MED ORDER — ALBUTEROL SULFATE HFA 108 (90 BASE) MCG/ACT IN AERS: INHALATION_SPRAY | RESPIRATORY_TRACT | 0 refills | Status: DC

## 2021-10-05 NOTE — Assessment & Plan Note (Signed)
Patient expresses frustration over inability to conceive with significant other.  She had an Implanon placed in 2014 and removed in 2020 as she began family-planning at that time.  States that a few months after Implanon removal, her menses normalized and she began having regular menses monthly that last about 5 to 6 days with no heavy bleeding.  She states that her and her partner have been trying to have a child for over 2 years now but she has been unable to conceive.  States that they have been having unprotected intercourse about 3-4 times a week throughout this time.   I will refer to OB/GYN at this time for further evaluation of infertility in this young female without any significant risk factors.  She is due for Pap smear which we were unable to perform here in the clinic.  She is also unvaccinated for HPV and thus will require catch-up vaccination which can be initiated at her OB/GYN visit.  Plan: -Placed referral to OB/GYN for infertility evaluation, Pap smear, and catch-up HPV vaccination

## 2021-10-05 NOTE — Assessment & Plan Note (Signed)
Patient received her flu shot today.  Unable to perform Pap smear due to patient intolerance.  She is also on vaccinated for HPV and thus requires catch-up vaccination, which we unfortunately do not have here at the clinic.  However, she has been referred to OB/GYN and can get both of these completed there.

## 2021-10-05 NOTE — Progress Notes (Signed)
° °  CC: Inability to conceive  HPI:  [name] is a 26 y.o. female with history listed below presenting to the [facility] for evaluation of inability to conceive. Please see individualized problem based charting for full HPI.  Past Medical History:  Diagnosis Date   Asthma    Back pain 05/17/2020   ED visit 9/18--given robaxin and mobic OV 9/20--robaxin refilled   Migraine    Rash 01/06/2019   Sinus congestion 07/22/2019    Review of Systems:  Negative aside from that listed in individualized problem based charting.  Physical Exam:  Vitals:   10/05/21 1338  BP: 110/71  Pulse: 71  Resp: (!) 28  Temp: 98.9 F (37.2 C)  TempSrc: Oral  SpO2: 100%  Weight: 151 lb 12.8 oz (68.9 kg)  Height: 4\' 11"  (1.499 m)   Physical Exam Constitutional:      Appearance: Normal appearance. She is not ill-appearing.  HENT:     Mouth/Throat:     Mouth: Mucous membranes are moist.     Pharynx: Oropharynx is clear. No oropharyngeal exudate.  Eyes:     Extraocular Movements: Extraocular movements intact.     Conjunctiva/sclera: Conjunctivae normal.     Pupils: Pupils are equal, round, and reactive to light.  Cardiovascular:     Rate and Rhythm: Normal rate and regular rhythm.     Pulses: Normal pulses.     Heart sounds: Normal heart sounds. No murmur heard.   No gallop.  Pulmonary:     Effort: Pulmonary effort is normal.     Breath sounds: Normal breath sounds. No wheezing, rhonchi or rales.  Abdominal:     General: Bowel sounds are normal. There is no distension.     Palpations: Abdomen is soft.     Tenderness: There is no abdominal tenderness.  Musculoskeletal:        General: No swelling. Normal range of motion.  Skin:    General: Skin is warm and dry.  Neurological:     General: No focal deficit present.     Mental Status: She is alert and oriented to person, place, and time.  Psychiatric:        Mood and Affect: Mood normal.        Behavior: Behavior normal.      Assessment & Plan:   See Encounters Tab for problem based charting.  Patient discussed with Dr. 

## 2021-10-05 NOTE — Patient Instructions (Addendum)
Shelby Pittman,  It was a pleasure seeing you in the clinic today.   I placed a referral to OBGYN so that they can speak with you about next steps in trying to conceive. They will also speak with you about the HPV vaccines. Someone will call you to schedule this appointment. I have refilled your albuterol inhaler. Please come back in 2 months to see your primary care doctor, Dr. Barbaraann Faster.  Please call our clinic at [phone] if you have any questions or concerns. The best time to call is Monday-Friday from 9am-4pm, but there is someone available 24/7 at the same number. If you need medication refills, please notify your pharmacy one week in advance and they will send [place] a request.   Thank you for letting us take part in your care. We look forward to seeing you next time!

## 2021-10-06 NOTE — Progress Notes (Signed)
Internal Medicine Clinic Attending  I saw and evaluated the patient.  I personally confirmed the key portions of the history and exam documented by Dr. Jinwala and I reviewed pertinent patient test results.  The assessment, diagnosis, and plan were formulated together and I agree with the documentation in the resident's note.  

## 2021-11-29 ENCOUNTER — Other Ambulatory Visit: Payer: Self-pay | Admitting: Student

## 2021-11-29 DIAGNOSIS — J4520 Mild intermittent asthma, uncomplicated: Secondary | ICD-10-CM

## 2022-02-25 ENCOUNTER — Other Ambulatory Visit: Payer: Self-pay | Admitting: Internal Medicine

## 2022-02-25 DIAGNOSIS — J4520 Mild intermittent asthma, uncomplicated: Secondary | ICD-10-CM

## 2022-03-14 ENCOUNTER — Telehealth: Payer: Self-pay

## 2022-03-14 ENCOUNTER — Other Ambulatory Visit (HOSPITAL_COMMUNITY): Payer: Self-pay

## 2022-03-14 DIAGNOSIS — J4540 Moderate persistent asthma, uncomplicated: Secondary | ICD-10-CM

## 2022-03-14 MED ORDER — ALBUTEROL SULFATE HFA 108 (90 BASE) MCG/ACT IN AERS
2.0000 | INHALATION_SPRAY | Freq: Four times a day (QID) | RESPIRATORY_TRACT | 2 refills | Status: DC | PRN
  Filled 2022-03-14: qty 18, 25d supply, fill #0
  Filled 2022-03-14: qty 6.7, 25d supply, fill #0
  Filled 2022-04-12: qty 6.7, 25d supply, fill #1
  Filled 2022-05-23: qty 6.7, 25d supply, fill #2

## 2022-03-14 NOTE — Telephone Encounter (Signed)
Requesting to speak with a nurse about recalled on albuterol inhaler. Please call back.

## 2022-03-14 NOTE — Telephone Encounter (Signed)
Call to patient got a letter about a recall on the Albuterol Inhaler.  Would like to change to something else that will be affordable.

## 2022-03-17 ENCOUNTER — Other Ambulatory Visit (HOSPITAL_COMMUNITY): Payer: Self-pay

## 2022-03-29 ENCOUNTER — Emergency Department (HOSPITAL_BASED_OUTPATIENT_CLINIC_OR_DEPARTMENT_OTHER)
Admission: EM | Admit: 2022-03-29 | Discharge: 2022-03-29 | Disposition: A | Discharge status: Home / Self Care | Payer: PRIVATE HEALTH INSURANCE | Attending: Emergency Medicine | Admitting: Emergency Medicine

## 2022-03-29 ENCOUNTER — Other Ambulatory Visit: Payer: Self-pay

## 2022-03-29 DIAGNOSIS — F29 Unspecified psychosis not due to a substance or known physiological condition: Secondary | ICD-10-CM | POA: Insufficient documentation

## 2022-03-29 DIAGNOSIS — J45909 Unspecified asthma, uncomplicated: Secondary | ICD-10-CM | POA: Insufficient documentation

## 2022-03-29 DIAGNOSIS — F32A Depression, unspecified: Secondary | ICD-10-CM | POA: Insufficient documentation

## 2022-03-29 DIAGNOSIS — Z20822 Contact with and (suspected) exposure to covid-19: Secondary | ICD-10-CM | POA: Insufficient documentation

## 2022-03-29 DIAGNOSIS — R45851 Suicidal ideations: Secondary | ICD-10-CM | POA: Insufficient documentation

## 2022-03-29 LAB — CBC WITH DIFFERENTIAL/PLATELET
Abs Immature Granulocytes: 0.02 10*3/uL (ref 0.00–0.07)
Basophils Absolute: 0 10*3/uL (ref 0.0–0.1)
Basophils Relative: 1 %
Eosinophils Absolute: 0.2 10*3/uL (ref 0.0–0.5)
Eosinophils Relative: 2 %
HCT: 42 % (ref 36.0–46.0)
Hemoglobin: 14.1 g/dL (ref 12.0–15.0)
Immature Granulocytes: 0 %
Lymphocytes Relative: 36 %
Lymphs Abs: 2.9 10*3/uL (ref 0.7–4.0)
MCH: 30.8 pg (ref 26.0–34.0)
MCHC: 33.6 g/dL (ref 30.0–36.0)
MCV: 91.7 fL (ref 80.0–100.0)
Monocytes Absolute: 0.4 10*3/uL (ref 0.1–1.0)
Monocytes Relative: 5 %
Neutro Abs: 4.5 10*3/uL (ref 1.7–7.7)
Neutrophils Relative %: 56 %
Platelets: 261 10*3/uL (ref 150–400)
RBC: 4.58 MIL/uL (ref 3.87–5.11)
RDW: 12.8 % (ref 11.5–15.5)
WBC: 8 10*3/uL (ref 4.0–10.5)
nRBC: 0 % (ref 0.0–0.2)

## 2022-03-29 LAB — COMPREHENSIVE METABOLIC PANEL
ALT: 10 U/L (ref 0–44)
AST: 16 U/L (ref 15–41)
Albumin: 4.7 g/dL (ref 3.5–5.0)
Alkaline Phosphatase: 30 U/L — ABNORMAL LOW (ref 38–126)
Anion gap: 13 (ref 5–15)
BUN: 12 mg/dL (ref 6–20)
CO2: 21 mmol/L — ABNORMAL LOW (ref 22–32)
Calcium: 10.1 mg/dL (ref 8.9–10.3)
Chloride: 103 mmol/L (ref 98–111)
Creatinine, Ser: 0.72 mg/dL (ref 0.44–1.00)
GFR, Estimated: >60 mL/min (ref >60)
Glucose, Bld: 77 mg/dL (ref 70–99)
Potassium: 3.6 mmol/L (ref 3.5–5.1)
Sodium: 137 mmol/L (ref 135–145)
Total Bilirubin: 0.7 mg/dL (ref 0.3–1.2)
Total Protein: 7.6 g/dL (ref 6.5–8.1)

## 2022-03-29 LAB — RESP PANEL BY RT-PCR (FLU A&B, COVID) ARPGX2
Influenza A by PCR: NEGATIVE
Influenza B by PCR: NEGATIVE
SARS Coronavirus 2 by RT PCR: NEGATIVE

## 2022-03-29 LAB — ETHANOL: Alcohol, Ethyl (B): <10 mg/dL (ref <10)

## 2022-03-29 NOTE — ED Notes (Signed)
Reviewed discharge information with patient. All patient's questions answered. Pt ambulatory to car with mother.

## 2022-03-29 NOTE — Discharge Instructions (Addendum)
You were seen in the emergency department today for suicidal ideations.  As we discussed I think that you would benefit from some of the resources that I have listed for you.  It is a long list of some mental health providers in the area for you to investigate who might work best for you.  It also has listed some hotlines if your in a bad place and are unable to get access to resources quickly.  There is also a behavioral health urgent care [place], that can sometimes get you quick access.  The address should be listed in your paperwork.  Please take care of yourself, continue to monitor how you are doing and return to the emergency department for any worsening symptoms.

## 2022-03-29 NOTE — BH Assessment (Signed)
Clinician called Diplomatic Services operational officer at [facility] and let her know there was no provider note in on patient at this time.

## 2022-03-29 NOTE — ED Notes (Signed)
Patient Belongings collected and put in belongings bag in secure cabinet in front nurses station.  Belongings brought with patient: Dress Sandals Underwear Blue bedsheet [employer] Ipad Apple watch Purse- wallet, Cash, ID and multiple cards.

## 2022-03-29 NOTE — ED Triage Notes (Signed)
Hx of depression and anxiety. Stopped medications in 2021 Self medicates with marijuana. Recent loss of pet, job and dropped out of school. Tearful in triage  States "I got a lot going on and I want to hurt myself but I dont want to hurt myself" Intrusive thoughts without plan or intention

## 2022-03-29 NOTE — ED Notes (Signed)
Pt's mom currently visiting with pt. When mom leaves, she is going to take pt's belongings home with her. Pt's belongings currently in secure cabinet at front nurses station until mom finishes her visit with pt.

## 2022-03-29 NOTE — ED Provider Notes (Signed)
[facility] EMERGENCY DEPT Provider Note   CSN: [id number] Arrival date & time: 03/29/22  1807     History  Chief Complaint  Patient presents with   Suicidal    Shelby Pittman is a 26 y.o. female who presents the emergency department complaining of depression with suicidal ideation.  Patient states that she has been dealing with anxiety and depression since 2020.  She was placed on Zoloft for period of time, but she stopped it after several months after she did not see any benefit.  Most recently she lost her emotional support dog several months ago, just lost her job and dropped out of classes.  Endorses significant stress.  Has been having some thoughts of suicide without an active plan.  Denies any previous attempt on her life.  Is not currently taking any psychiatric medications.  No other complaints today.  HPI     Home Medications Prior to Admission medications   Medication Sig Start Date End Date Taking? Authorizing Provider  albuterol (VENTOLIN HFA) 108 (90 Base) MCG/ACT inhaler Inhale 2 puffs into the lungs every 6 (six) hours as needed for wheezing or shortness of breath 03/14/22   Doran Stabler, DO  cetirizine (ZYRTEC) 10 MG tablet Take 1 tablet (10 mg total) by mouth daily. IM program. 12/31/18   Seawell, Jaimie A, DO  fluticasone (FLONASE) 50 MCG/ACT nasal spray Place 1 spray into both nostrils daily. 12/16/18   Levert Feinstein, MD  Fluticasone-Salmeterol (ADVAIR DISKUS) 500-50 MCG/DOSE AEPB Inhale 1 puff into the lungs 2 (two) times daily. 10/27/19   Albertha Ghee, MD  montelukast (SINGULAIR) 10 MG tablet Take 1 tablet (10 mg total) by mouth daily. 06/30/20 06/30/21  Albertha Ghee, MD  nitrofurantoin, macrocrystal-monohydrate, (MACROBID) 100 MG capsule Take 1 capsule (100 mg total) by mouth 2 (two) times daily. 05/15/20   Renne Crigler, PA-C  sertraline (ZOLOFT) 25 MG tablet Take 1 tablet (25 mg total) by mouth daily. 06/06/19 09/04/19  Albertha Ghee, MD  sodium  chloride (OCEAN) 0.65 % SOLN nasal spray Place 1 spray into both nostrils as needed for congestion. 08/20/18   Emi Holes, PA-C      Allergies    Patient has no known allergies.    Review of Systems   Review of Systems  Psychiatric/Behavioral:  Positive for suicidal ideas. The patient is nervous/anxious.   All other systems reviewed and are negative.   Physical Exam Updated Vital Signs BP 110/70 (BP Location: Right Arm)   Pulse (!) 49   Temp 97.8 F (36.6 C)   Resp 16   LMP 03/13/2022 (Exact Date)   SpO2 100%  Physical Exam Vitals and nursing note reviewed.  Constitutional:      Appearance: Normal appearance.  HENT:     Head: Normocephalic and atraumatic.  Eyes:     Conjunctiva/sclera: Conjunctivae normal.  Pulmonary:     Effort: Pulmonary effort is normal. No respiratory distress.  Skin:    General: Skin is warm and dry.  Neurological:     Mental Status: She is alert.  Psychiatric:        Attention and Perception: Attention and perception normal. She is attentive. She does not perceive auditory or visual hallucinations.        Mood and Affect: Affect normal. Mood is depressed.        Speech: Speech normal.        Behavior: Behavior normal. Behavior is cooperative.        Thought Content: Thought  content is not paranoid or delusional. Thought content includes suicidal ideation. Thought content does not include homicidal ideation. Thought content does not include homicidal or suicidal plan.        Cognition and Memory: Cognition and memory normal.        Judgment: Judgment normal.     ED Results / Procedures / Treatments   Labs (all labs ordered are listed, but only abnormal results are displayed) Labs Reviewed  COMPREHENSIVE METABOLIC PANEL - Abnormal; Notable for the following components:      Result Value   CO2 21 (*)    Alkaline Phosphatase 30 (*)    All other components within normal limits  RESP PANEL BY RT-PCR (FLU A&B, COVID) ARPGX2  ETHANOL  CBC  WITH DIFFERENTIAL/PLATELET  RAPID URINE DRUG SCREEN, HOSP PERFORMED  PREGNANCY, URINE    EKG None  Radiology No results found.  Procedures Procedures    Medications Ordered in ED Medications - No data to display  ED Course/ Medical Decision Making/ A&P                           Medical Decision Making Amount and/or Complexity of Data Reviewed Labs: ordered.   Patient is a 26 year old female with history of asthma and migraines who presents the emergency department of depression and suicidal ideations.  Endorses thoughts of suicide without active plan.  Denies homicidal ideation, visual or auditory hallucinations.  She is not currently on any psychiatric medications.  On exam patient is in no acute distress.  Normal vital signs.  Demonstrates normal cognition and judgment.  Normal attention perception.  Depressed mood with normal affect.  Very cooperative.  Has no plan for suicide.  Medical screening was ordered and results interpreted by myself.  Determined to be medically cleared.  Contacted TTS.  10:00pm Staff notified me that patient would like to be discharged home and follow-up with behavioral health outpatient.  I think this is reasonable.  She in my opinion is of sound mind, and I believe she demonstrates capacity to make medical decisions.  Is not clinically impaired.  She is here voluntarily, and I see no reason to IVC her at this time.  Will discharge to home with resources and give close return precautions.  Patient understands that she is to return to the ER for any worsening symptoms.  Her and her mother are agreeable to the plan.        Final Clinical Impression(s) / ED Diagnoses Final diagnoses:  Depression, unspecified depression type  Passive suicidal ideations    Rx / DC Orders ED Discharge Orders     None      Portions of this report may have been transcribed using voice recognition software. Every effort was made to ensure accuracy; however,  inadvertent computerized transcription errors may be present.    Jeanella Flattery 03/29/22 2244    Linwood Dibbles, MD 04/03/22 [phone]

## 2022-03-29 NOTE — ED Notes (Signed)
Mom back at bedside

## 2022-03-29 NOTE — ED Notes (Signed)
Pt's belongings are given back to the pt to change into clothes and go home.

## 2022-03-29 NOTE — ED Notes (Signed)
Mom at bedside.

## 2022-04-12 ENCOUNTER — Other Ambulatory Visit (HOSPITAL_COMMUNITY): Payer: Self-pay

## 2022-05-23 ENCOUNTER — Other Ambulatory Visit (HOSPITAL_COMMUNITY): Payer: Self-pay

## 2022-06-28 ENCOUNTER — Other Ambulatory Visit (HOSPITAL_COMMUNITY): Payer: Self-pay

## 2022-06-28 ENCOUNTER — Other Ambulatory Visit: Payer: Self-pay | Admitting: Student

## 2022-06-28 DIAGNOSIS — J4540 Moderate persistent asthma, uncomplicated: Secondary | ICD-10-CM

## 2022-06-28 MED ORDER — ALBUTEROL SULFATE HFA 108 (90 BASE) MCG/ACT IN AERS
2.0000 | INHALATION_SPRAY | Freq: Four times a day (QID) | RESPIRATORY_TRACT | 2 refills | Status: DC | PRN
  Filled 2022-06-28: qty 6.7, 25d supply, fill #0
  Filled 2022-07-24: qty 6.7, 25d supply, fill #1
  Filled 2022-08-15: qty 6.7, 25d supply, fill #2

## 2022-07-24 ENCOUNTER — Other Ambulatory Visit (HOSPITAL_COMMUNITY): Payer: Self-pay

## 2022-07-25 ENCOUNTER — Other Ambulatory Visit (HOSPITAL_COMMUNITY): Payer: Self-pay

## 2022-08-15 ENCOUNTER — Other Ambulatory Visit (HOSPITAL_COMMUNITY): Payer: Self-pay

## 2022-09-12 ENCOUNTER — Other Ambulatory Visit: Payer: Self-pay | Admitting: Student

## 2022-09-12 ENCOUNTER — Other Ambulatory Visit (HOSPITAL_COMMUNITY): Payer: Self-pay

## 2022-09-12 DIAGNOSIS — J4540 Moderate persistent asthma, uncomplicated: Secondary | ICD-10-CM

## 2022-09-12 NOTE — Telephone Encounter (Signed)
albuterol (VENTOLIN HFA) 108 (90 Base) MCG/ACT inhaler [[id number]  Pt states she is just getting over COVID and is out of the her medication and really needs it soon.  [facility] [address] [place] [address] Phone: [phone]  Fax: [phone]

## 2022-09-13 ENCOUNTER — Other Ambulatory Visit (HOSPITAL_COMMUNITY): Payer: Self-pay

## 2022-09-13 NOTE — Telephone Encounter (Signed)
Pt states she need her inhaler by today. Requesting to speak with a nurse.

## 2022-09-14 ENCOUNTER — Ambulatory Visit (INDEPENDENT_AMBULATORY_CARE_PROVIDER_SITE_OTHER): Payer: 59 | Admitting: Student

## 2022-09-14 ENCOUNTER — Other Ambulatory Visit (HOSPITAL_COMMUNITY): Payer: Self-pay

## 2022-09-14 VITALS — BP 131/82 | HR 68 | Temp 98.4°F | Ht 59.0 in | Wt 171.6 lb

## 2022-09-14 DIAGNOSIS — R69 Illness, unspecified: Secondary | ICD-10-CM | POA: Diagnosis not present

## 2022-09-14 DIAGNOSIS — N979 Female infertility, unspecified: Secondary | ICD-10-CM

## 2022-09-14 DIAGNOSIS — F320 Major depressive disorder, single episode, mild: Secondary | ICD-10-CM

## 2022-09-14 DIAGNOSIS — J4520 Mild intermittent asthma, uncomplicated: Secondary | ICD-10-CM

## 2022-09-14 DIAGNOSIS — F32A Depression, unspecified: Secondary | ICD-10-CM

## 2022-09-14 DIAGNOSIS — J4540 Moderate persistent asthma, uncomplicated: Secondary | ICD-10-CM | POA: Diagnosis not present

## 2022-09-14 MED ORDER — BUDESONIDE-FORMOTEROL FUMARATE 80-4.5 MCG/ACT IN AERO
2.0000 | INHALATION_SPRAY | Freq: Two times a day (BID) | RESPIRATORY_TRACT | 12 refills | Status: DC
  Filled 2022-09-14: qty 10.2, 30d supply, fill #0

## 2022-09-14 MED ORDER — MONTELUKAST SODIUM 10 MG PO TABS: 10.0000 mg | ORAL_TABLET | Freq: Every day | ORAL | 3 refills | Status: DC

## 2022-09-14 MED ORDER — BUDESONIDE-FORMOTEROL FUMARATE 80-4.5 MCG/ACT IN AERO
INHALATION_SPRAY | RESPIRATORY_TRACT | 12 refills | Status: DC
  Filled 2022-09-14: qty 10.2, 30d supply, fill #0
  Filled 2022-09-14: qty 1, fill #0
  Filled 2022-09-15: qty 10.2, 30d supply, fill #0

## 2022-09-14 MED ORDER — SERTRALINE HCL 25 MG PO TABS
25.0000 mg | ORAL_TABLET | Freq: Every day | ORAL | 3 refills | Status: DC
  Filled 2022-09-14: qty 90, 90d supply, fill #0
  Filled 2022-09-15: qty 30, 30d supply, fill #0

## 2022-09-14 MED ORDER — MONTELUKAST SODIUM 10 MG PO TABS
10.0000 mg | ORAL_TABLET | Freq: Every day | ORAL | 3 refills | Status: DC
  Filled 2022-09-14: qty 30, 30d supply, fill #0

## 2022-09-14 NOTE — Progress Notes (Incomplete)
CC: ***  HPI:   Shelby Pittman is a 27 y.o. female with a past medical history of asthma and migrainous headaches who presents for routine follow-up. She was last seen at [facility] in 09-2021.   MAINTENANCE Unable to perform Pap smear due to patient intolerance. She is also on vaccinated for HPV and thus requires catch-up vaccination, which we unfortunately do not have here at the clinic. However, she has been referred to OB/GYN and can get both of these completed there. - NEVER FOLLOWED UP  Xxx Pap smear due   INABILITY TO CONCEIVE Patient expresses frustration over inability to conceive with significant other.  She had an Implanon placed in 2014 and removed in 2020 as she began family-planning at that time.  States that a few months after Implanon removal, her menses normalized and she began having regular menses monthly that last about 5 to 6 days with no heavy bleeding.  She states that her and her partner have been trying to have a child for over 2 years now but she has been unable to conceive.  States that they have been having unprotected intercourse about 3-4 times a week throughout this time.  I will refer to OB/GYN at this time for further evaluation of infertility in this young female without any significant risk factors.  She is due for Pap smear which we were unable to perform here in the clinic.  She is also unvaccinated for HPV and thus will require catch-up vaccination which can be initiated at her OB/GYN visit. Plan: -Placed referral to OB/GYN for infertility evaluation, Pap smear, and catch-up HPV vaccination - NEVER HAPPENED  Xxxxx    Past Medical History:  Diagnosis Date   Asthma    Back pain 05/17/2020   ED visit 9/18--given robaxin and mobic OV 9/20--robaxin refilled   Migraine    Rash 01/06/2019   Sinus congestion 07/22/2019   Uses contraceptive implants as primary birth control method 09/28/2016     Review of Systems:    Reports *** Denies  *** (subjective fever?, pain anywhere?, bowel changes?)   Physical Exam:  There were no vitals filed for this visit.  General:   awake and alert, sitting comfortably in chair, cooperative, not in acute distress Skin:   warm and dry, intact without any obvious lesions or scars, no rashes or lesions  Head:   normocephalic and atraumatic, oral mucosa moist with good dentition, no lymphadenopathy Eyes:   extraocular movements intact, conjunctivae pink, pupils round and reactive to light, no periorbital swelling or scleral icterus Ears:   pinnae normal, no discharge or external lesions  Nose:   symmetrical and without mucosal inflammation, no external lesions or discharge Lungs:   normal respiratory effort, breathing unlabored, symmetrical chest rise, no crackles or wheezing Cardiac:   regular rate and rhythm, normal S1 and S2, capillary refill 2-3 seconds, dorsalis pedis pulses intact bilaterally, no pitting edema Abdomen:   soft and non-distended, normoactive bowel sounds present in all four quadrants, no guarding or palpable masses Musculoskeletal:   full range of motion in joints, motor strength 5 /5 in all four extremities, no obvious deformities or joint tenderness Neurologic:   oriented to person-place-time, moving all extremities, sensation to light touch intact, no facial droop Psychiatric:   euthymic mood with congruent affect, intelligible speech    Assessment & Plan:   No problem-specific Assessment & Plan notes found for this encounter.     See Encounters Tab for problem based charting.  Patient {GC/GE:[id number]::"discussed with","seen with"} Dr. {NAMES:[id number]::"Guilloud","Hoffman","Mullen","Narendra","Williams","Vincent"}

## 2022-09-15 ENCOUNTER — Telehealth: Payer: Self-pay

## 2022-09-15 ENCOUNTER — Other Ambulatory Visit (HOSPITAL_COMMUNITY): Payer: Self-pay

## 2022-09-15 MED ORDER — ALBUTEROL SULFATE HFA 108 (90 BASE) MCG/ACT IN AERS
2.0000 | INHALATION_SPRAY | Freq: Four times a day (QID) | RESPIRATORY_TRACT | 2 refills | Status: DC | PRN
  Filled 2022-09-15: qty 6.7, 25d supply, fill #0

## 2022-09-15 NOTE — Telephone Encounter (Signed)
Pt wants  to make sure that  hme knows she uploaded her insurance card  to her my chart  to file for her appt yesterday

## 2022-09-17 ENCOUNTER — Encounter: Payer: Self-pay | Admitting: Student

## 2022-09-17 NOTE — Assessment & Plan Note (Signed)
Patient feels this is currently controlled but would like to restart her medication. Unfortunately PHQ9 was not obtained during last clinic visit. Will assess this next clinic visit.  Restarted sertraline

## 2022-09-17 NOTE — Progress Notes (Signed)
   CC: F/u asthma   HPI:  [name] is a 27 y.o. F with PMH per below who presents for follow up of her asthma.   Shelby Pittman D  Patient's second chart.  Patient states that she has been using her rescue albuterol inhaler frequently. She estimates that she may use her inhaler up to 10x/d. She has not had any recent hospitalizations for this however. She states that she recently moved back to [place] from [place].   She denies any current chest pain or dyspnea.   Past Medical History:  Diagnosis Date   Asthma    Back pain 05/17/2020   ED visit 9/18--given robaxin and mobic OV 9/20--robaxin refilled   Migraine    Rash 01/06/2019   Sinus congestion 07/22/2019   Uses contraceptive implants as primary birth control method 09/28/2016   Review of Systems:  Negative except per above   Physical Exam:  Vitals:   09/14/22 1526  BP: 131/82  Pulse: 68  Temp: 98.4 F (36.9 C)  TempSrc: Oral  SpO2: 100%  Weight: 171 lb 9.6 oz (77.8 kg)  Height: 4\' 11"  (1.499 m)    Constitutional: Well-developed, well-nourished, and in no distress.  Cardiovascular: Normal rate, regular rhythm, intact distal pulses. No gallop and no friction rub.  No murmur heard. No lower extremity edema  Pulmonary: Non labored breathing on room air, no wheezing or rales  Abdominal: Soft. Normal bowel sounds. Non distended and non tender Musculoskeletal: Normal range of motion.        General: No tenderness or edema.  Neurological: Alert and oriented to person, place, and time. Non focal  Skin: Skin is warm and dry.    Assessment & Plan:   See Encounters Tab for problem based charting.  Patient discussed with Dr. Philipp Ovens

## 2022-09-17 NOTE — Assessment & Plan Note (Addendum)
Patient has poorly controlled asthma. She states she uses her albuterol up to 10x/d. On exam she is currently not in exacerbation. No wheezing, non labored breathing on room air, O2 sats are normal. Discussed with patient that we should trial symbicort for rescue and maintenance therapy to see if we get better control of her asthma.  -Refill singulair  -Will send PFTs now that she is insured.  -She will follow up prior to leaving for [facility].

## 2022-09-17 NOTE — Assessment & Plan Note (Addendum)
Patient states she now has insurance. Resent referral to OBGYN.

## 2022-09-18 ENCOUNTER — Encounter: Payer: Self-pay | Admitting: Student

## 2022-09-18 ENCOUNTER — Telehealth: Payer: Self-pay

## 2022-09-18 NOTE — Telephone Encounter (Signed)
RTC to patient unable to afford the Symbicort or the Albuterol medications.  Currently looking for employment.  Family helped with 1 of the meds was able to get her depression med that was $5.  Will consult Rosendo Gros the [place] to see if she knows of anything that will help patient.

## 2022-09-18 NOTE — Telephone Encounter (Signed)
Requesting to speak with a nurse about medications. States she cannot afford to paid for her medication. Please call pt back.

## 2022-09-19 ENCOUNTER — Encounter: Payer: Self-pay | Admitting: Student

## 2022-09-19 NOTE — Progress Notes (Signed)
Internal Medicine Clinic Attending  Case discussed with Dr. Carter  At the time of the visit.  We reviewed the resident's history and exam and pertinent patient test results.  I agree with the assessment, diagnosis, and plan of care documented in the resident's note.  

## 2022-09-19 NOTE — Progress Notes (Signed)
  [facility] Internal Medicine Residency Telephone Encounter Continuity Care Appointment  HPI:  This telephone encounter was created for [name] on 09/21/2022 for the following purpose/cc follow up Asthma and Depression.   ---Asthma She has a history of moderate persistent asthma without complication and recently visited [facility] for again acute exacerbation.  At that time, she was prescribed Symbicort to use for rescue and maintenance therapy.  She was then told to follow-up in about 1 week before relocating to [place].  Since that visit, she has only experienced 1 episode of wheezing and breath shortness, which she treated with albuterol and this resolved her symptoms.  She never retrieved the Symbicort from her pharmacy because the co-pay was $40 and she currently is unemployed.  She is requesting a different prescription in the hopes that the cost will be less.  Explained to her that finding a new medication that is cheaper than $40 may prove difficult.  - Prescribe Breo Ellipta (fluticasone-vilanterol) q24 + PRN   ---Depression Patient has a history of depression and was recently started on sertraline at her last visit 1 week ago.  Since starting this medication 6 days ago, she has not noticed any changes in her mood.  She is tolerating this medication well and denies nausea, headaches, insomnia, fatigue, lethargy, dry mouth, dizziness, and weakness.  - Continue sertraline 25mg  q24      Past Medical History:  Past Medical History:  Diagnosis Date   Asthma    Migraine    Uses contraceptive implants as primary birth control method 09/28/2016     ROS:  Wheezing, breath shortness Denies mood changes, nausea, headaches, insomnia, fatigue, lethargy, dry mouth, dizziness, and weakness   Assessment / Plan / Recommendations:  Please see A&P under problem oriented charting for assessment of the patient's acute and chronic medical conditions.  As always, pt is advised that if  symptoms worsen or new symptoms arise, they should go to an urgent care facility or to to ER for further evaluation.   Consent and Medical Decision Making:  Patient discussed with Dr. Philipp Ovens This is a telephone encounter between Shelby Pittman and Shelby Pittman on 09/21/2022 for 77min. The visit was conducted with the patient located at home and Shelby Pittman at [facility]. The patient's identity was confirmed using their DOB and current address. The patient has consented to being evaluated through a telephone encounter and understands the associated risks (an examination cannot be done and the patient may need to come in for an appointment) / benefits (allows the patient to remain at home, decreasing exposure to coronavirus). I personally spent 20 minutes on medical discussion.

## 2022-09-19 NOTE — Addendum Note (Signed)
Addended by: Jodean Lima on: 09/19/2022 09:01 AM   Modules accepted: Level of Service

## 2022-09-20 ENCOUNTER — Other Ambulatory Visit (HOSPITAL_COMMUNITY): Payer: Self-pay

## 2022-09-20 NOTE — Telephone Encounter (Signed)
RTC to patient to inform her of the information from [place]. 1 of the medications will cost  around $26 and the other 1 more than that.Patient states that she is not working now and needs to get something cheaper.  Does not want her family to have to get her medications for her.

## 2022-09-21 ENCOUNTER — Other Ambulatory Visit (HOSPITAL_COMMUNITY): Payer: Self-pay

## 2022-09-21 ENCOUNTER — Ambulatory Visit (INDEPENDENT_AMBULATORY_CARE_PROVIDER_SITE_OTHER): Payer: 59 | Admitting: Student

## 2022-09-21 DIAGNOSIS — R69 Illness, unspecified: Secondary | ICD-10-CM | POA: Diagnosis not present

## 2022-09-21 DIAGNOSIS — F320 Major depressive disorder, single episode, mild: Secondary | ICD-10-CM

## 2022-09-21 DIAGNOSIS — J4520 Mild intermittent asthma, uncomplicated: Secondary | ICD-10-CM | POA: Diagnosis not present

## 2022-09-21 MED ORDER — BREO ELLIPTA 50-25 MCG/INH IN AEPB
1.0000 | INHALATION_SPRAY | Freq: Every day | RESPIRATORY_TRACT | 2 refills | Status: DC
  Filled 2022-09-21: qty 60, 30d supply, fill #0

## 2022-09-25 ENCOUNTER — Other Ambulatory Visit (HOSPITAL_COMMUNITY): Payer: Self-pay

## 2022-10-02 ENCOUNTER — Other Ambulatory Visit: Payer: Self-pay | Admitting: Student

## 2022-10-02 ENCOUNTER — Ambulatory Visit (INDEPENDENT_AMBULATORY_CARE_PROVIDER_SITE_OTHER): Payer: 59 | Admitting: Student

## 2022-10-02 ENCOUNTER — Encounter: Payer: Self-pay | Admitting: Student

## 2022-10-02 ENCOUNTER — Other Ambulatory Visit (HOSPITAL_COMMUNITY)
Admission: RE | Admit: 2022-10-02 | Discharge: 2022-10-02 | Disposition: A | Discharge status: Home / Self Care | Payer: 59 | Source: Ambulatory Visit | Attending: Internal Medicine | Admitting: Internal Medicine

## 2022-10-02 VITALS — BP 127/78 | HR 63 | Temp 98.1°F | Ht 59.0 in | Wt 168.6 lb

## 2022-10-02 DIAGNOSIS — J4540 Moderate persistent asthma, uncomplicated: Secondary | ICD-10-CM

## 2022-10-02 DIAGNOSIS — Z9189 Other specified personal risk factors, not elsewhere classified: Secondary | ICD-10-CM | POA: Insufficient documentation

## 2022-10-02 DIAGNOSIS — Z113 Encounter for screening for infections with a predominantly sexual mode of transmission: Secondary | ICD-10-CM

## 2022-10-02 MED ORDER — AIRSUPRA 90-80 MCG/ACT IN AERO: 1.0000 | INHALATION_SPRAY | RESPIRATORY_TRACT | 2 refills | Status: DC | PRN

## 2022-10-02 NOTE — Assessment & Plan Note (Signed)
Patient presents with a request for STI testing.  She is in a new relationship and is planning on becoming sexually active in the near future.  Her last sexual encounter was several months ago and her last period was a few weeks ago.  She denies any current symptoms, except for some thin white discharge, which is normal for her.  - Urine cytology for chlamydia, gonorrhea, and candidiasis - Collect blood for HIV and RPR

## 2022-10-02 NOTE — Progress Notes (Signed)
   CC: STI Testing  HPI:   [name] is a 27 y.o. female with a past medical history of asthma and migraines who presents for STI testing. She was last seen at [facility] on 1-18 and a virtual visit was conducted on 1-24.    Past Medical History:  Diagnosis Date   Asthma    Migraine    Uses contraceptive implants as primary birth control method 09/28/2016     Review of Systems:    Reports feeling good overall Denies wheezing, cough, breath shortness, vaginal discharge, bleeding, pruritus     Physical Exam:  Vitals:   10/02/22 1451  BP: 127/78  Pulse: 63  Temp: 98.1 F (36.7 C)  TempSrc: Oral  SpO2: 100%  Weight: 168 lb 9.6 oz (76.5 kg)  Height: 4\' 11"  (1.499 m)    General:   awake and alert, sitting comfortably in chair, cooperative, not in acute distress Lungs:   normal respiratory effort, breathing unlabored, symmetrical chest rise, no crackles or wheezing Cardiac:   regular rate and rhythm, normal S1 and S2 Neurologic:   oriented to person-place-time, moving all extremities, no gross focal deficits Psychiatric:   euthymic mood with congruent affect, intelligible speech    Assessment & Plan:   Routine screening for STI (sexually transmitted infection) Patient presents with a request for STI testing.  She is in a new relationship and is planning on becoming sexually active in the near future.  Her last sexual encounter was several months ago and her last period was a few weeks ago.  She denies any current symptoms, except for some thin white discharge, which is normal for her.  - Urine cytology for chlamydia, gonorrhea, and candidiasis - Collect blood for HIV and RPR    Asthma Patient has a history of moderate persistent asthma without complication and recently visited [facility] for an acute exacerbation, at that time she was prescribed Symbicort but reports that the $40 co-pay was too much for her because she is currently unemployed.  A prescription for Breo  Ellipta was placed in the hopes that this would be less expensive, but the co-pay was even higher.  Patient expressed disinterest in pursuing additional options given her financial situation.  He is currently asymptomatic and is not experiencing an acute asthma exacerbation.  A coupon, with $0 copay, was provided for the rescue formulation Airsupra.  - Start Airsupra (albuterol-budesonide) q4 PRN      See Encounters Tab for problem based charting.  Patient discussed with Dr. Heber Lynn

## 2022-10-02 NOTE — Progress Notes (Signed)
Internal Medicine Clinic Attending  Case discussed with Dr. Jodi Mourning  At the time of the visit.  We reviewed the resident's history and exam and pertinent patient test results.  I agree with the assessment, diagnosis, and plan of care documented in the resident's note.

## 2022-10-02 NOTE — Assessment & Plan Note (Signed)
Patient has a history of moderate persistent asthma without complication and recently visited [facility] for an acute exacerbation, at that time she was prescribed Symbicort but reports that the $40 co-pay was too much for her because she is currently unemployed.  A prescription for Breo Ellipta was placed in the hopes that this would be less expensive, but the co-pay was even higher.  Patient expressed disinterest in pursuing additional options given her financial situation.  He is currently asymptomatic and is not experiencing an acute asthma exacerbation.  A coupon, with $0 copay, was provided for the rescue formulation Airsupra.  - Start Airsupra (albuterol-budesonide) q4 PRN

## 2022-10-02 NOTE — Patient Instructions (Signed)
  Thank you, [name], for allowing [place] to provide your care today. Today we discussed . . .  > STI Screening       - we are ordering some laboratory testing to determine if you have an STI and will call you with the results > Asthma       - we are providing you with a coupon for an inhaled rescue medication called Airsupra     I have ordered the following labs for you:  Lab Orders         RPR         HIV antibody (with reflex)        Tests ordered today:  Urine STI test   Referrals ordered today:   Referral Orders  No referral(s) requested today      I have ordered the following medication/changed the following medications:   Stop the following medications: There are no discontinued medications.   Start the following medications: No orders of the defined types were placed in this encounter.     Follow up: 1 year    Remember:  We will call you with your laboratory results.   Should you have any questions or concerns please call the internal medicine clinic at [phone].     Shelby Nickel, MD [place]

## 2022-10-03 ENCOUNTER — Telehealth: Payer: Self-pay

## 2022-10-03 ENCOUNTER — Other Ambulatory Visit: Payer: Self-pay | Admitting: Student

## 2022-10-03 LAB — URINE CYTOLOGY ANCILLARY ONLY
Candida Urine: NEGATIVE
Chlamydia: NEGATIVE
Comment: NEGATIVE
Comment: NEGATIVE
Comment: NORMAL
Neisseria Gonorrhea: NEGATIVE
Trichomonas: NEGATIVE

## 2022-10-03 LAB — HIV ANTIBODY (ROUTINE TESTING W REFLEX): HIV Screen 4th Generation wRfx: NONREACTIVE

## 2022-10-03 LAB — RPR: RPR Ser Ql: NONREACTIVE

## 2022-10-03 NOTE — Telephone Encounter (Signed)
Pt is requesting a call back .Shelby Pittman She is  wanting to have her test results explained I explained to her that yes the  results pop up the same time the dr gets them  in  [place] and some one will cal her when he /she reads them

## 2022-10-03 NOTE — Telephone Encounter (Signed)
Patient notified that HIV and syphilis are negative. Urine cytology has not yet resulted. Assured her Provider will call her once urine result is back. She was very Patent attorney.

## 2022-10-09 NOTE — Progress Notes (Signed)
Internal Medicine Clinic Attending  Case discussed with the resident at the time of the visit.  We reviewed the resident's history and exam and pertinent patient test results.  I agree with the assessment, diagnosis, and plan of care documented in the resident's note.  

## 2022-10-16 ENCOUNTER — Telehealth: Payer: Self-pay | Admitting: *Deleted

## 2022-10-16 ENCOUNTER — Ambulatory Visit (INDEPENDENT_AMBULATORY_CARE_PROVIDER_SITE_OTHER): Payer: 59 | Admitting: *Deleted

## 2022-10-16 DIAGNOSIS — F32A Depression, unspecified: Secondary | ICD-10-CM

## 2022-10-16 DIAGNOSIS — Z111 Encounter for screening for respiratory tuberculosis: Secondary | ICD-10-CM | POA: Diagnosis not present

## 2022-10-16 NOTE — Telephone Encounter (Signed)
RTC to patient has been scheduled at the [facility] on [address] fr 12/06/2022 at 1:55 PM.  Patient was called and informed of the appointment.

## 2022-10-19 ENCOUNTER — Encounter: Payer: Self-pay | Admitting: *Deleted

## 2022-10-19 LAB — TB SKIN TEST
Induration: 0 mm
TB Skin Test: NEGATIVE

## 2022-10-23 ENCOUNTER — Encounter: Payer: 59 | Admitting: Student

## 2022-10-25 ENCOUNTER — Other Ambulatory Visit (HOSPITAL_COMMUNITY): Payer: Self-pay

## 2022-11-13 ENCOUNTER — Other Ambulatory Visit: Payer: Self-pay | Admitting: Internal Medicine

## 2022-11-13 DIAGNOSIS — J4540 Moderate persistent asthma, uncomplicated: Secondary | ICD-10-CM

## 2022-12-06 ENCOUNTER — Encounter: Payer: 59 | Admitting: Obstetrics and Gynecology

## 2022-12-28 ENCOUNTER — Other Ambulatory Visit (HOSPITAL_COMMUNITY): Payer: Self-pay

## 2022-12-28 ENCOUNTER — Ambulatory Visit (INDEPENDENT_AMBULATORY_CARE_PROVIDER_SITE_OTHER): Payer: 59 | Admitting: Student

## 2022-12-28 DIAGNOSIS — J4540 Moderate persistent asthma, uncomplicated: Secondary | ICD-10-CM

## 2022-12-28 NOTE — Progress Notes (Signed)
Patient with telehealth visit today needing letter to discontinue her gym membership. States exercise is exacerbating her asthma. Overall asthma is poorly controlled. Only using albuterol and takes Singulair. Unable to afford symbicort of bero ellipta. Letter provided and sent to her MyChart. She would like to set up an appointment for asthma tomorrow or early next week. I will message our clinic pharmacy tech regarding PA for inhalers.

## 2022-12-29 ENCOUNTER — Other Ambulatory Visit (HOSPITAL_COMMUNITY)
Admission: RE | Admit: 2022-12-29 | Discharge: 2022-12-29 | Disposition: A | Discharge status: Home / Self Care | Payer: 59 | Source: Ambulatory Visit | Attending: Internal Medicine | Admitting: Internal Medicine

## 2022-12-29 ENCOUNTER — Ambulatory Visit (INDEPENDENT_AMBULATORY_CARE_PROVIDER_SITE_OTHER): Payer: 59

## 2022-12-29 ENCOUNTER — Other Ambulatory Visit (HOSPITAL_COMMUNITY): Payer: Self-pay

## 2022-12-29 VITALS — BP 128/86 | HR 68 | Wt 173.2 lb

## 2022-12-29 DIAGNOSIS — Z0000 Encounter for general adult medical examination without abnormal findings: Secondary | ICD-10-CM | POA: Diagnosis not present

## 2022-12-29 DIAGNOSIS — Z23 Encounter for immunization: Secondary | ICD-10-CM

## 2022-12-29 DIAGNOSIS — F32A Depression, unspecified: Secondary | ICD-10-CM | POA: Diagnosis not present

## 2022-12-29 DIAGNOSIS — J4540 Moderate persistent asthma, uncomplicated: Secondary | ICD-10-CM | POA: Diagnosis not present

## 2022-12-29 DIAGNOSIS — Z113 Encounter for screening for infections with a predominantly sexual mode of transmission: Secondary | ICD-10-CM

## 2022-12-29 MED ORDER — BUDESONIDE-FORMOTEROL FUMARATE 80-4.5 MCG/ACT IN AERO: 2.0000 | INHALATION_SPRAY | Freq: Every day | RESPIRATORY_TRACT | 12 refills | Status: DC

## 2022-12-29 MED ORDER — SERTRALINE HCL 50 MG PO TABS
50.0000 mg | ORAL_TABLET | Freq: Every day | ORAL | 1 refills | Status: DC
Start: 2022-12-29 — End: 2023-01-25

## 2022-12-29 MED ORDER — BUDESONIDE-FORMOTEROL FUMARATE 80-4.5 MCG/ACT IN AERO
2.0000 | INHALATION_SPRAY | Freq: Every day | RESPIRATORY_TRACT | 12 refills | Status: DC
  Filled 2022-12-29: qty 10.2, 30d supply, fill #0
  Filled 2023-01-23: qty 10.2, 30d supply, fill #1
  Filled 2023-03-04 – 2023-04-15 (×2): qty 10.2, 30d supply, fill #2
  Filled 2023-06-20: qty 10.2, 30d supply, fill #3
  Filled 2023-08-08 (×2): qty 10.2, 30d supply, fill #4
  Filled 2023-10-04: qty 10.2, 30d supply, fill #5

## 2022-12-29 NOTE — Assessment & Plan Note (Addendum)
Moderate persistent asthma but has difficulty affording maintenance inhaler. Saw Dr. Elaina Pattee for tele visit earlier this week, asked to come for in person visit and discussed cost with our pharmacy staff. Looked into options for patient assistance and unfortunately she doesn't qualify due to her commercial insurance, she also has a high deductible. Options are: " Generic Breo is $40  Brand Breo $91  Generic symbicort $40  Brand symbicort not on formulary  " [facility].  She reports today that she has been waking up almost every night with dyspnea and using her albuterol inhaler more than usual. In clinic, she is not in respiratory distress, is speaking in full sentences, and mentions that she feels ok currently, but main issue is that she cannot afford a controller inhaler. Respiratory exam unremarkable, she is satting 100% on room air and has no wheezing. Not an asthma exacerbation, likely just worse symptoms than usual due to not having maintenance inhaler. -start symbicort [facility], she is ok with the cost of this -ideally would start SMART therapy but due to cost, can also use albuterol for rescue as she has been doing

## 2022-12-29 NOTE — Progress Notes (Signed)
   CC: asthma  HPI:  [name] is a 27 y.o.-year-old female with past medical history as below presenting for asthma.  Please see encounters tab for problem-based charting.  Past Medical History:  Diagnosis Date   Asthma    Migraine    Uses contraceptive implants as primary birth control method 09/28/2016   Review of Systems: As in HPI.  Please see encounters tab for problem based charting.  Physical Exam:  Vitals:   12/29/22 1029  BP: 128/86  Pulse: 68  SpO2: 100%  Weight: 173 lb 3.2 oz (78.6 kg)   General:Well-appearing, pleasant, In NAD Cardiac: RRR, no murmurs rubs or gallops. Respiratory: Normal work of breathing on room air, CTAB. No wheezing or respiratory distress, speaking in full sentences Abdominal: Soft, nontender, nondistended   Assessment & Plan:   Asthma Moderate persistent asthma but has difficulty affording maintenance inhaler. Saw Dr. Elaina Pattee for tele visit earlier this week, asked to come for in person visit and discussed cost with our pharmacy staff. Looked into options for patient assistance and unfortunately she doesn't qualify due to her commercial insurance, she also has a high deductible. Options are: " Generic Breo is $40  Brand Breo $91  Generic symbicort $40  Brand symbicort not on formulary  " [facility].  She reports today that she has been waking up almost every night with dyspnea and using her albuterol inhaler more than usual. In clinic, she is not in respiratory distress, is speaking in full sentences, and mentions that she feels ok currently, but main issue is that she cannot afford a controller inhaler. Respiratory exam unremarkable, she is satting 100% on room air and has no wheezing. Not an asthma exacerbation, likely just worse symptoms than usual due to not having maintenance inhaler. -start symbicort [facility], she is ok with the  cost of this -ideally would start SMART therapy but due to cost, can also use albuterol for rescue as she has been doing  Depression Feels like zoloft is not helping as much anymore, mood is worse, less energy. Endorses occasional SI without a plan. Has no firearms in home. No HI. No AVH or mania. Open to CBT. -increase zoloft to 50mg  daily -integrated behavioral health referral -she has crisis plan  Health care maintenance Tdap today Pap smear with ob Routine STI testing  Routine screening for STI (sexually transmitted infection) Wants repeat testing today, asymptomatic   Patient discussed with Dr.  Lafonda Mosses

## 2022-12-29 NOTE — Assessment & Plan Note (Addendum)
Tdap today Pap smear with ob Routine STI testing

## 2022-12-29 NOTE — Assessment & Plan Note (Addendum)
Feels like zoloft is not helping as much anymore, mood is worse, less energy. Endorses occasional SI without a plan. Has no firearms in home. No HI. No AVH or mania. Open to CBT. -increase zoloft to 50mg  daily -integrated behavioral health referral -she has crisis plan

## 2022-12-29 NOTE — Patient Instructions (Signed)
Shelby Pittman, it was a pleasure seeing you today! You endorsed feeling well today. Below are some of the things we talked about this visit. We look forward to seeing you in the follow up appointment!  Today we discussed: I sent the symbicort to the [facility]. Make sure to mention the charge account, the cost should be $40.  [facility] 50mg  [facility] and bianca will call to make an appt  I have ordered the following labs today:  Lab Orders  No laboratory test(s) ordered today      Referrals ordered today:   Referral Orders         Ambulatory referral to Integrated Behavioral Health      I have ordered the following medication/changed the following medications:   Stop the following medications: Medications Discontinued During This Encounter  Medication Reason   Albuterol-Budesonide (AIRSUPRA) 90-80 MCG/ACT AERO Cost of medication   sertraline (ZOLOFT) 25 MG tablet Reorder   budesonide-formoterol (SYMBICORT) 80-4.5 MCG/ACT inhaler Reorder     Start the following medications: Meds ordered this encounter  Medications   DISCONTD: budesonide-formoterol (SYMBICORT) 80-4.5 MCG/ACT inhaler    Sig: Inhale 2 puffs into the lungs daily.    Dispense:  1 each    Refill:  12   sertraline (ZOLOFT) 50 MG tablet    Sig: Take 1 tablet (50 mg total) by mouth daily.    Dispense:  30 tablet    Refill:  1   budesonide-formoterol (SYMBICORT) 80-4.5 MCG/ACT inhaler    Sig: Inhale 2 puffs into the lungs daily.    Dispense:  10.2 g    Refill:  12    Please use charge account     Follow-up:  4-6 weeks    Please make sure to arrive 15 minutes prior to your next appointment. If you arrive late, you may be asked to reschedule.   We look forward to seeing you next time. Please call our clinic at [phone] if you have any questions or concerns. The best time to call is Monday-Friday from 9am-4pm, but there is someone available 24/7. If after hours or  the weekend, call the main hospital number and ask for the Internal Medicine Resident On-Call. If you need medication refills, please notify your pharmacy one week in advance and they will send [place] a request.  Thank you for letting us take part in your care. Wishing you the best!  Thank you, Lyndle Herrlich MD

## 2022-12-29 NOTE — Assessment & Plan Note (Signed)
Wants repeat testing today, asymptomatic

## 2022-12-30 LAB — RPR: RPR Ser Ql: NONREACTIVE

## 2022-12-30 LAB — HIV ANTIBODY (ROUTINE TESTING W REFLEX): HIV Screen 4th Generation wRfx: NONREACTIVE

## 2023-01-01 LAB — URINE CYTOLOGY ANCILLARY ONLY
Bacterial Vaginitis-Urine: NEGATIVE
Candida Urine: NEGATIVE
Chlamydia: NEGATIVE
Comment: NEGATIVE
Comment: NEGATIVE
Comment: NORMAL
Neisseria Gonorrhea: NEGATIVE
Trichomonas: NEGATIVE

## 2023-01-01 NOTE — Progress Notes (Signed)
Internal Medicine Clinic Attending  Case discussed with Dr. Sridharan  At the time of the visit.  We reviewed the resident's history and exam and pertinent patient test results.  I agree with the assessment, diagnosis, and plan of care documented in the resident's note.  

## 2023-01-05 NOTE — Progress Notes (Signed)
I discussed this patient with Dr. Elaina Pattee.  Agree with need for in person visit.  Debe Coder, MD

## 2023-01-08 ENCOUNTER — Other Ambulatory Visit: Payer: Self-pay | Admitting: Student

## 2023-01-08 ENCOUNTER — Other Ambulatory Visit (HOSPITAL_COMMUNITY): Payer: Self-pay

## 2023-01-08 DIAGNOSIS — J4540 Moderate persistent asthma, uncomplicated: Secondary | ICD-10-CM

## 2023-01-10 ENCOUNTER — Encounter: Payer: Self-pay | Admitting: Obstetrics and Gynecology

## 2023-01-10 DIAGNOSIS — S0993XA Unspecified injury of face, initial encounter: Secondary | ICD-10-CM

## 2023-01-11 ENCOUNTER — Encounter: Payer: 59 | Admitting: Obstetrics and Gynecology

## 2023-01-12 ENCOUNTER — Telehealth: Payer: Self-pay | Admitting: *Deleted

## 2023-01-12 NOTE — Telephone Encounter (Signed)
[place] left a voicemail this am that she has been trying to call since Tuesday when she got reminder call. States she has called several times and no one answered. States she thinks that our phones don't work and it is unprofessional. States she has been trying to call to cancel and reschedule the appointment she had scheduled on 01/11/23. States she is on her period and needs to reschedule. She asks for a call back.  I will route to registrar.  Nancy Fetter

## 2023-01-21 ENCOUNTER — Other Ambulatory Visit: Payer: Self-pay

## 2023-01-21 DIAGNOSIS — F32A Depression, unspecified: Secondary | ICD-10-CM

## 2023-01-23 ENCOUNTER — Other Ambulatory Visit (HOSPITAL_COMMUNITY): Payer: Self-pay

## 2023-01-30 ENCOUNTER — Encounter: Payer: Self-pay | Admitting: *Deleted

## 2023-02-06 ENCOUNTER — Ambulatory Visit (INDEPENDENT_AMBULATORY_CARE_PROVIDER_SITE_OTHER): Payer: Self-pay | Admitting: Licensed Clinical Social Worker

## 2023-02-06 DIAGNOSIS — J4540 Moderate persistent asthma, uncomplicated: Secondary | ICD-10-CM

## 2023-02-06 NOTE — BH Specialist Note (Signed)
Patient no-showed today's appointment; appointment was for Telephone visit at 2:30pm.  [facility] unable to connect with patient due to phone number not working.   Patient will need to reschedule appointment by calling Internal medicine center [phone].  Christen Butter, MSW, LCSW-A She/Her Behavioral Health Clinician [facility]  Internal Medicine Center Direct Dial:[phone]  Fax [phone] Main Office Phone: [phone] [address] [place] [place], [place] [address] Website: [facility] Internal Medicine [facility]  [place], [place]  [place]

## 2023-02-26 ENCOUNTER — Encounter: Payer: 59 | Admitting: Obstetrics and Gynecology

## 2023-02-27 ENCOUNTER — Ambulatory Visit (INDEPENDENT_AMBULATORY_CARE_PROVIDER_SITE_OTHER): Payer: Self-pay | Admitting: Licensed Clinical Social Worker

## 2023-02-27 ENCOUNTER — Telehealth: Payer: Self-pay | Admitting: Family Medicine

## 2023-02-27 DIAGNOSIS — F32A Depression, unspecified: Secondary | ICD-10-CM

## 2023-02-27 NOTE — Telephone Encounter (Signed)
Patient sent in MyChart message. Addressing concerns via [employer]. Maureen Ralphs RN on 02/27/23 at [phone]

## 2023-02-27 NOTE — BH Specialist Note (Signed)
Patient no-showed today's appointment; appointment was for Telephone visit at 3:30Pm  Behavioral Health Clinician [facility] from here on out)  attempted patient twice via Telephone number [phone]    [facility] contacted patient from telephone number [phone]. [facility] unable to leave a  VM.   Patient will need to reschedule appointment by calling Internal medicine center [phone].  Christen Butter, MSW, LCSW-A She/Her Behavioral Health Clinician [facility]  Internal Medicine Center Direct Dial:[phone]  Fax [phone] Main Office Phone: [phone] [address] [place] [place], [place] [address] Website: [facility] Internal Medicine [facility]  [place], [place]  [place]

## 2023-02-27 NOTE — Telephone Encounter (Signed)
Patient has questions about menopause, would like a call back asap

## 2023-02-28 ENCOUNTER — Other Ambulatory Visit: Payer: Self-pay | Admitting: Obstetrics and Gynecology

## 2023-02-28 DIAGNOSIS — R232 Flushing: Secondary | ICD-10-CM

## 2023-03-06 ENCOUNTER — Ambulatory Visit (INDEPENDENT_AMBULATORY_CARE_PROVIDER_SITE_OTHER): Payer: 59 | Admitting: Licensed Clinical Social Worker

## 2023-03-06 DIAGNOSIS — F32A Depression, unspecified: Secondary | ICD-10-CM

## 2023-03-06 NOTE — BH Specialist Note (Signed)
Integrated Behavioral Health via Telemedicine Visit  03/06/2023 Shelby Pittman [id number]  Number of Integrated Behavioral Health Clinician visits: 1- Initial Visit  Session Start time: 1530   Session End time: 1600  Total time in minutes: 30   Referring Provider: Charissa Bash, MD Patient/Family location: Home [facility] Provider location: Office All persons participating in visit: [facility] and patient Types of Service: General Behavioral Integrated Care (BHI)  I connected with Shelby S Laverne and/or Shelby S Kostelnik's  via  Telephone or [employer]  (Video is Surveyor, mining) and verified that I am speaking with the correct person using two identifiers. Discussed confidentiality: Yes   I discussed the limitations of telemedicine and the availability of in person appointments.  Discussed there is a possibility of technology failure and discussed alternative modes of communication if that failure occurs.  I discussed that engaging in this telemedicine visit, they consent to the provision of behavioral healthcare and the services will be billed under their insurance.  Patient and/or legal guardian expressed understanding and consented to Telemedicine visit: Yes   Presenting Concerns: Patient and/or family reports the following symptoms/concerns: Patient expressed feeling depressed and medication not helping much.  Duration of problem: more than a year; Severity of problem: moderate  Patient and/or Family's Strengths/Protective Factors: Sense of purpose  Goals Addressed: Patient will:  Reduce symptoms of: depression   Increase knowledge and/or ability of: coping skills   Demonstrate ability to: Increase healthy adjustment to current life circumstances  Progress towards Goals: Ongoing  Interventions: Interventions utilized:  CBT Cognitive Behavioral Therapy Standardized Assessments completed: PHQ-SADS     12/29/2022   10:31 AM 10/05/2021     2:08 PM 05/17/2020   11:42 AM  PHQ-SADS Last 3 Score only  PHQ Adolescent Score 11 9 10      Patient and/or Family Response: Patient is optimistic about CBT.   Assessment: Patient currently experiencing depression. CBT internvention will be utilized during sessions.   Patient may benefit from CBT .  Plan: Follow up with behavioral health clinician on : within the next 30 days.  I discussed the assessment and treatment plan with the patient and/or parent/guardian. They were provided an opportunity to ask questions and all were answered. They agreed with the plan and demonstrated an understanding of the instructions.   They were advised to call back or seek an in-person evaluation if the symptoms worsen or if the condition fails to improve as anticipated.  Christen Butter, MSW, LCSW-A She/Her Behavioral Health Clinician [facility]  Internal Medicine Center Direct Dial:[phone]  Fax [phone] Main Office Phone: [phone] [address] [place] [place], [place] [address] Website: [facility] Internal Medicine [facility]  [place], [place]  [place]

## 2023-03-12 ENCOUNTER — Other Ambulatory Visit (HOSPITAL_COMMUNITY): Payer: Self-pay

## 2023-03-12 MED FILL — Albuterol Sulfate Inhal Aero 108 MCG/ACT (90MCG Base Equiv): RESPIRATORY_TRACT | 25 days supply | Qty: 6.7 | Fill #0 | Status: AC

## 2023-03-13 ENCOUNTER — Other Ambulatory Visit (HOSPITAL_COMMUNITY): Payer: Self-pay

## 2023-03-13 MED FILL — Sertraline HCl Tab 50 MG: ORAL | 30 days supply | Qty: 30 | Fill #0 | Status: CN

## 2023-03-20 ENCOUNTER — Other Ambulatory Visit (HOSPITAL_COMMUNITY): Payer: Self-pay

## 2023-03-20 MED FILL — Sertraline HCl Tab 50 MG: ORAL | 30 days supply | Qty: 30 | Fill #0 | Status: AC

## 2023-03-29 ENCOUNTER — Encounter: Payer: 59 | Admitting: Obstetrics and Gynecology

## 2023-04-03 ENCOUNTER — Ambulatory Visit: Payer: Self-pay | Admitting: Licensed Clinical Social Worker

## 2023-04-04 ENCOUNTER — Ambulatory Visit (INDEPENDENT_AMBULATORY_CARE_PROVIDER_SITE_OTHER): Payer: 59 | Admitting: Licensed Clinical Social Worker

## 2023-04-04 ENCOUNTER — Telehealth: Payer: Self-pay | Admitting: Dietician

## 2023-04-04 DIAGNOSIS — F32A Depression, unspecified: Secondary | ICD-10-CM

## 2023-04-04 NOTE — Telephone Encounter (Signed)
Referred by Christen Butter for general Nutritional counseling. Discussed with patient how to schedule and find out her coverage for same. Letter mailed today.

## 2023-04-04 NOTE — BH Specialist Note (Signed)
Integrated Behavioral Health Follow Up In-Person Visit  MRN: [id number] Name: Shelby Pittman  Number of Integrated Behavioral Health Clinician visits: 2- Second Visit  Session Start time: 1400   Session End time: 1500  Total time in minutes: 60   Types of Service: General Behavioral Integrated Care (BHI)  Interpretor:No. Interpretor Name and Language: N/A  Subjective: Shelby Pittman is a 27 y.o. female  Patient was referred by PCP for Depression. Patient reports the following symptoms/concerns: Patient stated the Increase of medication has helped and she feels better.  Patient has since started new employment and will be working from home.  Patient states no SI.  Patient reports energy level is doing better.  Patient reports stress in 11 year relationship with boyfriend. Boyfriend currently lives in [place]. [facility] and patient discussed ways to communicate with boyfriend.  Patient wants information about nutrition and stopping smoking marijuana. [facility] referred patient to D. Pyler nutritionist and [facility]. [facility] also sent message to PCP.  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 Patient will start researching schools, cost and deadlines.  Duration of problem: more than 5 years; Severity of problem: moderate  Objective: Mood: NA and Affect: Appropriate Risk of harm to self or others: No plan to harm self or others  Life Context: Family and Social: Patient states she has a very supportive family.  School/Work: Patient will begin employment on 08/19. Working from home. Self-Care: Patient enjoys going to the gym  Life Changes: Patient wants to stop smoking.  Patient and/or Family's Strengths/Protective Factors: Sense of purpose  Goals Addressed: Patient will:  Reduce symptoms of: depression   Increase knowledge and/or ability of: coping skills   Demonstrate ability to: Increase healthy adjustment to current life  circumstances  Progress towards Goals: Ongoing  Interventions: Interventions utilized:  Motivational Interviewing, Solution-Focused Strategies, and CBT Cognitive Behavioral Therapy Standardized Assessments completed: PHQ-SADS     12/29/2022   10:31 AM 10/05/2021    2:08 PM 05/17/2020   11:42 AM  PHQ-SADS Last 3 Score only  PHQ Adolescent Score 11 9 10      Patient and/or Family Response: Patient agrees to continue therapy   Patient Centered Plan: Patient is on the following Treatment Plan(s): Ongoing therapy  Assessment: Patient currently experiencing depression.   Patient may benefit from ongoing therapy.  Plan: Follow up with behavioral health clinician on : Within two weeks Christen Butter, MSW, LCSW-A She/Her Behavioral Health Clinician [facility]  Internal Medicine Center Direct [phone]  Fax [phone] Main Office Phone: [phone] [address] [place] [place], [place] [address] Website: [facility] Internal Medicine [facility]  [place], [place]  [facility]

## 2023-04-05 ENCOUNTER — Ambulatory Visit (INDEPENDENT_AMBULATORY_CARE_PROVIDER_SITE_OTHER): Payer: 59 | Admitting: Student

## 2023-04-05 VITALS — BP 117/76 | HR 60 | Temp 98.1°F | Ht 59.0 in | Wt 172.5 lb

## 2023-04-05 DIAGNOSIS — M549 Dorsalgia, unspecified: Secondary | ICD-10-CM

## 2023-04-05 DIAGNOSIS — G8929 Other chronic pain: Secondary | ICD-10-CM

## 2023-04-05 DIAGNOSIS — Z0000 Encounter for general adult medical examination without abnormal findings: Secondary | ICD-10-CM

## 2023-04-05 MED ORDER — METHOCARBAMOL 750 MG PO TABS: 750.0000 mg | ORAL_TABLET | Freq: Every day | ORAL | 0 refills | Status: AC

## 2023-04-05 MED ORDER — IBUPROFEN 200 MG PO CAPS: 800.0000 mg | ORAL_CAPSULE | Freq: Two times a day (BID) | ORAL | 0 refills | Status: AC

## 2023-04-05 MED ORDER — TOPIRAMATE 25 MG PO TABS: 25.0000 mg | ORAL_TABLET | Freq: Every day | ORAL | 3 refills | Status: AC

## 2023-04-05 NOTE — Patient Instructions (Addendum)
Thank you for allowing me to be a part of your care team. Today we discussed a few things:  1. I sent in a prescription for Advil, Robaxin (the muscle relaxer), and Topamax. 2. Please use the muscle relaxer and Topamax with caution in regards to is sedating affects. 3. I sent a referral for OBGYN and for Physical therapy 4.Topmax, Robaxin, and Advil can all have negative affects on a developing fetus so keep this in mind if you are sexually active/have plans to become pregnant.
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
## 2023-04-15 MED FILL — Sertraline HCl Tab 50 MG: ORAL | 30 days supply | Qty: 30 | Fill #1 | Status: AC

## 2023-04-15 MED FILL — Albuterol Sulfate Inhal Aero 108 MCG/ACT (90MCG Base Equiv): RESPIRATORY_TRACT | 25 days supply | Qty: 6.7 | Fill #1 | Status: AC

## 2023-04-16 ENCOUNTER — Other Ambulatory Visit (HOSPITAL_COMMUNITY): Payer: Self-pay

## 2023-04-16 NOTE — Progress Notes (Signed)
 Internal Medicine Clinic Attending  I was physically present during the key portions of the resident provided service and participated in the medical decision making of patient's management care. I reviewed pertinent patient test results.  The assessment, diagnosis, and plan were formulated together and I agree with the documentation in the resident's note.  Inez Catalina, MD

## 2023-04-18 ENCOUNTER — Ambulatory Visit (INDEPENDENT_AMBULATORY_CARE_PROVIDER_SITE_OTHER): Payer: 59 | Admitting: Licensed Clinical Social Worker

## 2023-04-18 DIAGNOSIS — F320 Major depressive disorder, single episode, mild: Secondary | ICD-10-CM | POA: Diagnosis not present

## 2023-04-18 NOTE — BH Specialist Note (Signed)
Integrated Behavioral Health Follow Up In-Person Visit  MRN: [id number] Name: Shelby Pittman  Number of Integrated Behavioral Health Clinician visits: 2- Second Visit  Session Start time: 1400   Session End time: 1500  Total time in minutes: 60    Types of Service: General Behavioral Integrated Care (BHI)   Interpretor:No. Interpretor Name and Language: N/A   Subjective: Shelby S Pichette is a 27 y.o. female  Patient was referred by PCP for Depression.  Patient reports the following symptoms/concerns: Patient advised her new employment was a scam and she is currently still looking for positions.   Patient stated the Increase of medication has helped and she feels better, but she still lacks energy.  Patient states no SI.   Patient reports stress in 11 year relationship with boyfriend. Boyfriend currently lives in [place]. [facility] and patient discussed ways to communicate with boyfriend.   Patient spoke with Lorelee New regarding nutrition.  [facility].  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 Patient will start researching schools, cost and deadlines.  Duration of problem: more than 5 years; Severity of problem: moderate   Objective: Mood: NA and Affect: Appropriate Risk of harm to self or others: No plan to harm self or others   Life Context: Family and Social: Patient states she has a very supportive family.  School/Work: Patient will begin employment on 08/19. Working from home. Self-Care: Patient enjoys going to the gym  Life Changes: Patient wants to stop smoking.   Patient and/or Family's Strengths/Protective Factors: Sense of purpose   Goals Addressed: Patient will:  Reduce symptoms of: depression   Increase knowledge and/or ability of: coping skills   Demonstrate ability to: Increase healthy adjustment to current life circumstances   Progress towards Goals: Ongoing    Interventions: Interventions utilized:  Motivational Interviewing, Solution-Focused Strategies, and CBT Cognitive Behavioral Therapy Standardized Assessments completed: PHQ-SADS       12/29/2022   10:31 AM 10/05/2021    2:08 PM 05/17/2020   11:42 AM  PHQ-SADS Last 3 Score only  PHQ Adolescent Score 11 9 10       Patient and/or Family Response: Patient agrees to continue therapy    Patient Centered Plan: Patient is on the following Treatment Plan(s): Ongoing therapy  Assessment: Patient currently experiencing depression.    Patient may benefit from ongoing therapy.   Plan: Follow up with behavioral health clinician on : Within two weeks Christen Butter, MSW, LCSW-A She/Her Behavioral Health Clinician [facility]  Internal Medicine Center Direct Dial:[phone]  Fax [phone] Main Office Phone: [phone] [address] [place] [place], [place] [address] Website: [facility] Internal Medicine [facility]  [place], [place]  [facility]

## 2023-05-03 ENCOUNTER — Ambulatory Visit (INDEPENDENT_AMBULATORY_CARE_PROVIDER_SITE_OTHER): Payer: 59 | Admitting: Licensed Clinical Social Worker

## 2023-05-03 DIAGNOSIS — F320 Major depressive disorder, single episode, mild: Secondary | ICD-10-CM

## 2023-05-03 NOTE — BH Specialist Note (Signed)
Patient no-showed today's appointment; appointment was for Telephone visit at 9:45am  Behavioral Health Clinician [facility] from here on out)  attempted patient  via Telephone .[facility] contacted patient from telephone number [phone]. [facility] left a  VM.   Patient will need to reschedule appointment by calling Internal medicine center [phone].  Christen Butter, MSW, LCSW-A She/Her Behavioral Health Clinician [facility]  Internal Medicine Center Direct Dial:[phone]  Fax [phone] Main Office Phone: [phone] [address] [place] [place], [place] [address] Website: [facility] Internal Medicine [facility]  [place], [place]  [place]

## 2023-05-08 ENCOUNTER — Telehealth: Payer: Self-pay

## 2023-05-08 ENCOUNTER — Ambulatory Visit (INDEPENDENT_AMBULATORY_CARE_PROVIDER_SITE_OTHER): Payer: 59 | Admitting: Licensed Clinical Social Worker

## 2023-05-08 DIAGNOSIS — F320 Major depressive disorder, single episode, mild: Secondary | ICD-10-CM

## 2023-05-08 NOTE — Telephone Encounter (Signed)
Pt is requesting a call back ... She stated that she did not receive a call on her phone or her moms line for her tele health  with you

## 2023-05-08 NOTE — BH Specialist Note (Signed)
[facility] attempted patient and unable to get through to phone or VM. Patient may benefit from in-person visits with [facility].   Christen Butter, MSW, LCSW-A She/Her Behavioral Health Clinician [facility]  Internal Medicine Center Direct [phone]  Fax [phone] Main Office Phone: [phone] [address] [place] [place], [place] [address] Website: [facility] Internal Medicine [facility]  [place], [place]  [place]

## 2023-05-15 ENCOUNTER — Ambulatory Visit (INDEPENDENT_AMBULATORY_CARE_PROVIDER_SITE_OTHER): Payer: 59 | Admitting: Licensed Clinical Social Worker

## 2023-05-15 DIAGNOSIS — F320 Major depressive disorder, single episode, mild: Secondary | ICD-10-CM

## 2023-05-16 ENCOUNTER — Ambulatory Visit (INDEPENDENT_AMBULATORY_CARE_PROVIDER_SITE_OTHER): Payer: 59 | Admitting: Licensed Clinical Social Worker

## 2023-05-16 DIAGNOSIS — F320 Major depressive disorder, single episode, mild: Secondary | ICD-10-CM

## 2023-05-16 NOTE — BH Specialist Note (Unsigned)
Integrated Behavioral Health via Telemedicine Visit  05/16/2023 Shelby Pittman [id number]  Number of Integrated Behavioral Health Clinician visits: Additional Visit  Session Start time: 1530   Session End time: 1535  Total time in minutes: 5   Referring Provider: *** Patient/Family location: *** [facility] Provider location: *** All persons participating in visit: *** Types of Service: {CHL AMB TYPE OF SERVICE:[phone]}  I connected with Shelby Pittman and/or Shelby Pittman's {family members:20773} via  Telephone or Engineer, civil (consulting)  (Video is Surveyor, mining) and verified that I am speaking with the correct person using two identifiers. Discussed confidentiality: {YES/NO:21197}  I discussed the limitations of telemedicine and the availability of in person appointments.  Discussed there is a possibility of technology failure and discussed alternative modes of communication if that failure occurs.  I discussed that engaging in this telemedicine visit, they consent to the provision of behavioral healthcare and the services will be billed under their insurance.  Patient and/or legal guardian expressed understanding and consented to Telemedicine visit: {YES/NO:21197}  Presenting Concerns: Patient and/or family reports the following symptoms/concerns: *** Duration of problem: ***; Severity of problem: {Mild/Moderate/Severe:20260}  Patient and/or Family's Strengths/Protective Factors: {CHL AMB BH PROTECTIVE [phone]}  Goals Addressed: Patient will:  Reduce symptoms of: {IBH Symptoms:21014056}   Increase knowledge and/or ability of: {IBH Patient Tools:21014057}   Demonstrate ability to: {IBH Goals:21014053}  Progress towards Goals: {CHL AMB BH PROGRESS TOWARDS GOALS:[id number]}  Interventions: Interventions utilized:  {IBH Interventions:21014054} Standardized Assessments completed: {IBH Screening Tools:21014051}  Patient and/or  Family Response: ***  Assessment: Patient currently experiencing ***.   Patient may benefit from ***.  Plan: Follow up with behavioral health clinician on : *** Behavioral recommendations: *** Referral(s): {IBH Referrals:21014055}  I discussed the assessment and treatment plan with the patient and/or parent/guardian. They were provided an opportunity to ask questions and all were answered. They agreed with the plan and demonstrated an understanding of the instructions.   They were advised to call back or seek an in-person evaluation if the symptoms worsen or if the condition fails to improve as anticipated.  Elmer Sow

## 2023-05-16 NOTE — BH Specialist Note (Unsigned)
Integrated Behavioral Health via Telemedicine Visit  05/16/2023 Shelby Pittman [id number]  Number of Integrated Behavioral Health Clinician visits: 2- Second Visit  Session Start time: 1415   Session End time: 1515  Total time in minutes: 60   Referring Provider: Carmina Miller, DO Patient/Family location: [facility] Provider location: Office All persons participating in visit: Patient and [facility] Types of Service: Individual psychotherapy and Telephone visit  I connected with Shelby Pittman and/or Shelby S Beevers's  via  Telephone or [employer]  (Video is Surveyor, mining) and verified that I am speaking with the correct person using two identifiers. Discussed confidentiality: Yes   I discussed the limitations of telemedicine and the availability of in person appointments.  Discussed there is a possibility of technology failure and discussed alternative modes of communication if that failure occurs.  I discussed that engaging in this telemedicine visit, they consent to the provision of behavioral healthcare and the services will be billed under their insurance.  Patient and/or legal guardian expressed understanding and consented to Telemedicine visit: Yes   Presenting Concerns: Patient and/or family reports the following symptoms/concerns:  The patient reported ongoing stress related to family dynamics, particularly involving her twin sister and older sister. She expressed feelings of exclusion and distress due to not being invited to her twin sister's wedding planning events, which she attributes to conflicts with her older sister. The patient conveyed a sense of being targeted and blamed within the family system, noting that she has been labeled as "complicated" and accused of making situations more complex.  During the session, we explored the patient's emotional responses to these family conflicts and discussed strategies for effective  communication. We focused on the importance of maintaining a calm tone when addressing sensitive issues with family members. The patient showed receptiveness to learning new communication techniques that could potentially improve her relationships with her sisters. For ongoing treatment, we will continue to work on developing the patient's emotional regulation skills and assertiveness in family interactions.   Future sessions will focus on role-playing scenarios to practice calm and clear communication, as well as exploring the patient's core beliefs about her role in the family dynamic. We will also address any underlying anxiety or depression symptoms that may be exacerbated by these family tensions. Additionally, we will consider the unique aspects of twin relationships and how they may impact the current situation. If appropriate, family therapy sessions including the patient's sisters may be recommended to address the systemic issues contributing to the conflict.   Patient and/or Family's Strengths/Protective Factors: Sense of purpose  Goals Addressed: Patient will:  Reduce symptoms of: anxiety and depression   Increase knowledge and/or ability of: coping skills   Demonstrate ability to: Increase healthy adjustment to current life circumstances  Progress towards Goals: Ongoing  Interventions: Interventions utilized:  CBT Cognitive Behavioral Therapy Standardized Assessments completed: PHQ-SADS  Patient and/or Family Response: Patient agrees to ongoing therapy   Plan: Follow up with behavioral health clinician on : Within 30 days   I discussed the assessment and treatment plan with the patient and/or parent/guardian. They were provided an opportunity to ask questions and all were answered. They agreed with the plan and demonstrated an understanding of the instructions.   They were advised to call back or seek an in-person evaluation if the symptoms worsen or if the condition fails  to improve as anticipated.  Christen Butter, MSW, LCSW-A She/Her Behavioral Health Clinician [facility]  Internal Medicine Center Direct Dial:[phone]  Fax [phone] Main Office Phone: [phone] [address] [place] [place], [place] [address] Website: [facility] Internal Medicine [facility]  [place], [place]  [facility]

## 2023-05-17 ENCOUNTER — Other Ambulatory Visit: Payer: Self-pay

## 2023-05-17 ENCOUNTER — Other Ambulatory Visit (HOSPITAL_COMMUNITY): Payer: Self-pay

## 2023-05-17 MED FILL — Sertraline HCl Tab 50 MG: ORAL | 30 days supply | Qty: 30 | Fill #2 | Status: AC

## 2023-05-17 MED FILL — Sertraline HCl Tab 50 MG: ORAL | 30 days supply | Qty: 30 | Fill #2 | Status: CN

## 2023-05-24 ENCOUNTER — Encounter: Payer: Self-pay | Admitting: Student

## 2023-06-20 ENCOUNTER — Other Ambulatory Visit (HOSPITAL_COMMUNITY): Payer: Self-pay

## 2023-06-20 ENCOUNTER — Other Ambulatory Visit: Payer: Self-pay

## 2023-06-20 ENCOUNTER — Other Ambulatory Visit: Payer: Self-pay | Admitting: Student

## 2023-06-20 DIAGNOSIS — J4540 Moderate persistent asthma, uncomplicated: Secondary | ICD-10-CM

## 2023-06-20 MED ORDER — ALBUTEROL SULFATE HFA 108 (90 BASE) MCG/ACT IN AERS
2.0000 | INHALATION_SPRAY | Freq: Four times a day (QID) | RESPIRATORY_TRACT | 1 refills | Status: DC | PRN
Start: 2023-06-20 — End: 2023-10-04
  Filled 2023-06-20: qty 6.7, 25d supply, fill #0
  Filled 2023-08-08 (×2): qty 6.7, 25d supply, fill #1

## 2023-06-20 MED FILL — Sertraline HCl Tab 50 MG: ORAL | 30 days supply | Qty: 30 | Fill #3 | Status: AC

## 2023-08-08 ENCOUNTER — Other Ambulatory Visit: Payer: Self-pay | Admitting: Student

## 2023-08-08 ENCOUNTER — Other Ambulatory Visit (HOSPITAL_COMMUNITY): Payer: Self-pay

## 2023-08-08 MED FILL — Sertraline HCl Tab 50 MG: ORAL | 30 days supply | Qty: 30 | Fill #4 | Status: AC

## 2023-08-08 MED FILL — Sertraline HCl Tab 50 MG: ORAL | 30 days supply | Qty: 30 | Fill #4 | Status: CN

## 2023-09-24 ENCOUNTER — Other Ambulatory Visit: Payer: Self-pay

## 2023-09-24 ENCOUNTER — Encounter (HOSPITAL_BASED_OUTPATIENT_CLINIC_OR_DEPARTMENT_OTHER): Payer: Self-pay | Admitting: Emergency Medicine

## 2023-09-24 ENCOUNTER — Emergency Department (HOSPITAL_BASED_OUTPATIENT_CLINIC_OR_DEPARTMENT_OTHER)
Admission: EM | Admit: 2023-09-24 | Discharge: 2023-09-24 | Discharge status: Against Medical Advice | Payer: 59 | Attending: Emergency Medicine | Admitting: Emergency Medicine

## 2023-09-24 DIAGNOSIS — Z5321 Procedure and treatment not carried out due to patient leaving prior to being seen by health care provider: Secondary | ICD-10-CM | POA: Insufficient documentation

## 2023-09-24 DIAGNOSIS — R0981 Nasal congestion: Secondary | ICD-10-CM | POA: Diagnosis not present

## 2023-09-24 DIAGNOSIS — M791 Myalgia, unspecified site: Secondary | ICD-10-CM | POA: Diagnosis not present

## 2023-09-24 DIAGNOSIS — R519 Headache, unspecified: Secondary | ICD-10-CM | POA: Insufficient documentation

## 2023-09-24 DIAGNOSIS — Z1152 Encounter for screening for COVID-19: Secondary | ICD-10-CM | POA: Insufficient documentation

## 2023-09-24 LAB — RESP PANEL BY RT-PCR (RSV, FLU A&B, COVID)  RVPGX2
Influenza A by PCR: NEGATIVE
Influenza B by PCR: NEGATIVE
Resp Syncytial Virus by PCR: NEGATIVE
SARS Coronavirus 2 by RT PCR: NEGATIVE

## 2023-09-24 NOTE — ED Triage Notes (Signed)
C/o nasal congestion, headache, body aches and cough x1 week. Hx of migraines.

## 2023-10-04 ENCOUNTER — Other Ambulatory Visit (HOSPITAL_COMMUNITY): Payer: Self-pay

## 2023-10-04 ENCOUNTER — Encounter: Payer: Self-pay | Admitting: Internal Medicine

## 2023-10-04 ENCOUNTER — Other Ambulatory Visit: Payer: Self-pay

## 2023-10-04 ENCOUNTER — Ambulatory Visit (INDEPENDENT_AMBULATORY_CARE_PROVIDER_SITE_OTHER): Payer: 59 | Admitting: Internal Medicine

## 2023-10-04 ENCOUNTER — Other Ambulatory Visit (HOSPITAL_COMMUNITY)
Admission: RE | Admit: 2023-10-04 | Discharge: 2023-10-04 | Disposition: A | Discharge status: Home / Self Care | Payer: 59 | Source: Ambulatory Visit | Attending: Internal Medicine | Admitting: Internal Medicine

## 2023-10-04 VITALS — BP 129/67 | HR 61 | Temp 98.0°F | Ht 59.0 in | Wt 185.3 lb

## 2023-10-04 DIAGNOSIS — J301 Allergic rhinitis due to pollen: Secondary | ICD-10-CM

## 2023-10-04 DIAGNOSIS — G8929 Other chronic pain: Secondary | ICD-10-CM

## 2023-10-04 DIAGNOSIS — M545 Low back pain, unspecified: Secondary | ICD-10-CM

## 2023-10-04 DIAGNOSIS — F32A Depression, unspecified: Secondary | ICD-10-CM | POA: Diagnosis not present

## 2023-10-04 DIAGNOSIS — Z7251 High risk heterosexual behavior: Secondary | ICD-10-CM

## 2023-10-04 DIAGNOSIS — J4540 Moderate persistent asthma, uncomplicated: Secondary | ICD-10-CM

## 2023-10-04 DIAGNOSIS — B9689 Other specified bacterial agents as the cause of diseases classified elsewhere: Secondary | ICD-10-CM

## 2023-10-04 DIAGNOSIS — N760 Acute vaginitis: Secondary | ICD-10-CM

## 2023-10-04 MED ORDER — CETIRIZINE HCL 10 MG PO TABS: 10.0000 mg | ORAL_TABLET | Freq: Every day | ORAL | 11 refills | Status: DC

## 2023-10-04 MED ORDER — BUDESONIDE-FORMOTEROL FUMARATE 80-4.5 MCG/ACT IN AERO
2.0000 | INHALATION_SPRAY | Freq: Every day | RESPIRATORY_TRACT | 12 refills | Status: DC
  Filled 2023-10-04: qty 10.2, 30d supply, fill #0
  Filled 2023-12-27: qty 10.2, 30d supply, fill #1

## 2023-10-04 MED ORDER — ALBUTEROL SULFATE HFA 108 (90 BASE) MCG/ACT IN AERS
2.0000 | INHALATION_SPRAY | Freq: Four times a day (QID) | RESPIRATORY_TRACT | 1 refills | Status: DC | PRN
  Filled 2023-10-04: qty 6.7, 25d supply, fill #0
  Filled 2023-12-27: qty 6.7, 25d supply, fill #1

## 2023-10-04 MED ORDER — CETIRIZINE HCL 10 MG PO TABS
10.0000 mg | ORAL_TABLET | Freq: Every day | ORAL | 11 refills | Status: AC
  Filled 2023-10-04: qty 100, 100d supply, fill #0

## 2023-10-04 MED ORDER — DULOXETINE HCL 60 MG PO CPEP: 60.0000 mg | ORAL_CAPSULE | Freq: Every day | ORAL | 3 refills | Status: DC

## 2023-10-04 MED ORDER — FLUTICASONE PROPIONATE 50 MCG/ACT NA SUSP: 1.0000 | Freq: Every day | NASAL | 4 refills | Status: DC

## 2023-10-04 MED ORDER — FLUTICASONE PROPIONATE 50 MCG/ACT NA SUSP
1.0000 | Freq: Every day | NASAL | 4 refills | Status: AC
  Filled 2023-10-04 (×2): qty 16, 30d supply, fill #0

## 2023-10-04 MED ORDER — DULOXETINE HCL 60 MG PO CPEP
60.0000 mg | ORAL_CAPSULE | Freq: Every day | ORAL | 3 refills | Status: DC
  Filled 2023-10-04 – 2023-10-05 (×5): qty 30, 30d supply, fill #0
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
## 2023-10-04 NOTE — Assessment & Plan Note (Signed)
 Symptoms consistent with seasonal allergies. Plan: Restart Zyrtec  10 mg daily Restart Flonase  daily Asked her to try Nettie pot rinse as well
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
## 2023-10-04 NOTE — Patient Instructions (Addendum)
 Thank you, Ms.Crystalina S Shatto for allowing us  to provide your care today.   Sinus symptoms: Please start taking Zyrtec  daily.  Start using your Nettie pot daily as well.  Use Flonase  following Nettie pot use.  I think this will also help with your headache symptoms.  Difficulty getting pregnant: Listed below is the contact for OBGYN, please give them a call. [facility] in [place], [place]  Address: [address] [place], [place] [address]   Phone: [phone]  Unprotected sex: I have ordered testing for HIV and self swab I will call you with results.  Back pain: I have referred you back to physical therapy asked him about a co-pay whenever they call to make the appointment.  Depression/anxiety: I am switching you to a medication called duloxetine  or Cymbalta .  Hoping this will also help with your chronic back pain.    I have ordered the following medication/changed the following medications:   Stop the following medications: Medications Discontinued During This Encounter  Medication Reason   sertraline  (ZOLOFT ) 50 MG tablet Change in therapy   fluticasone  (FLONASE ) 50 MCG/ACT nasal spray Reorder   cetirizine  (ZYRTEC ) 10 MG tablet Reorder     Start the following medications: Meds ordered this encounter  Medications   DULoxetine  (CYMBALTA ) 60 MG capsule    Sig: Take 1 capsule (60 mg total) by mouth daily.    Dispense:  30 capsule    Refill:  3   cetirizine  (ZYRTEC ) 10 MG tablet    Sig: Take 1 tablet (10 mg total) by mouth daily. IM program.    Dispense:  90 tablet    Refill:  11    [facility] clinic   fluticasone  (FLONASE ) 50 MCG/ACT nasal spray    Sig: Place 1 spray into both nostrils daily.    Dispense:  5 g    Refill:  4     Follow up: 4-6 weeks   We look forward to seeing you next time. Please call our clinic at [phone] if you have any questions or concerns. The best time to call is Monday-Friday from 9am-4pm, but  there is someone available 24/7. If after hours or the weekend, call the main hospital number and ask for the Internal Medicine Resident On-Call. If you need medication refills, please notify your pharmacy one week in advance and they will send us  a request.   Thank you for trusting me with your care. Wishing you the best!   Izetta Medley, DO [facility] Internal [facility] Center

## 2023-10-04 NOTE — Assessment & Plan Note (Addendum)
 She requests testing today. No vaginal symptoms. P: HIV Sefl swab for g/c trich  Addendum: Self swab with BV, icalled and reviewed result with patient. Metronidazole  500 mg bid sent for 7 days.

## 2023-10-04 NOTE — Assessment & Plan Note (Signed)
 Zoloft  was increased to 50 mg last year.  She does not find this to be beneficial.  She was last seen by [facility] in September and she will reach out to her for follow-up. She denies suicidal and homicidal ideation P: Stop Zoloft  50 mg Switch to Cymbalta  60 mg Continue integrated behavioral health referral

## 2023-10-05 ENCOUNTER — Other Ambulatory Visit (HOSPITAL_COMMUNITY): Payer: Self-pay

## 2023-10-05 LAB — CERVICOVAGINAL ANCILLARY ONLY
Bacterial Vaginitis (gardnerella): POSITIVE — AB
Candida Glabrata: NEGATIVE
Candida Vaginitis: NEGATIVE
Chlamydia: NEGATIVE
Comment: NEGATIVE
Comment: NEGATIVE
Comment: NEGATIVE
Comment: NEGATIVE
Comment: NEGATIVE
Comment: NORMAL
Neisseria Gonorrhea: NEGATIVE
Trichomonas: NEGATIVE

## 2023-10-05 MED ORDER — METRONIDAZOLE 500 MG PO TABS
500.0000 mg | ORAL_TABLET | Freq: Two times a day (BID) | ORAL | 0 refills | Status: AC
  Filled 2023-10-05: qty 14, 7d supply, fill #0

## 2023-10-05 NOTE — Addendum Note (Signed)
 Addended by: Bishop Bullock on: 10/05/2023 03:42 PM   Modules accepted: Orders

## 2023-10-06 LAB — HIV ANTIBODY (ROUTINE TESTING W REFLEX): HIV Screen 4th Generation wRfx: NONREACTIVE

## 2023-10-08 ENCOUNTER — Telehealth: Payer: Self-pay | Admitting: *Deleted

## 2023-10-08 NOTE — Telephone Encounter (Addendum)
Call from patient has been taking Flagyl  and Cymbalta.  States thinks that the Cymbalta is making her sick.  She took the Flagyl 1 day and then the next day started the Cymbalta.  Thinks that the Cymbalta made her feel sick.  Has not taken either of the medications since Saturday.  Is feeling a little better. Plans to take 1 of the Cymbalta today to see if that was causing the problem.  For the Flagyl she understood that she should not drink any alcohol. Marland Kitchen

## 2023-10-08 NOTE — Progress Notes (Signed)
 Internal Medicine Clinic Attending  Case discussed with the resident at the time of the visit.  We reviewed the resident's history and exam and pertinent patient test results.  I agree with the assessment, diagnosis, and plan of care documented in the resident's note.

## 2023-10-15 NOTE — Telephone Encounter (Signed)
Patient called regarding her previous message patient stated she has been having a allergic reaction to her cymbalta and she has been throwing up. Patient is requesting a alternative medication. Patient is requesting a call back asap.

## 2023-10-16 ENCOUNTER — Other Ambulatory Visit (HOSPITAL_COMMUNITY): Payer: Self-pay

## 2023-10-16 MED ORDER — SERTRALINE HCL 50 MG PO TABS
50.0000 mg | ORAL_TABLET | Freq: Every day | ORAL | 3 refills | Status: AC
  Filled 2023-10-16: qty 30, 30d supply, fill #0
  Filled 2023-12-27: qty 30, 30d supply, fill #1

## 2023-10-16 MED ORDER — METHOCARBAMOL 750 MG PO TABS
750.0000 mg | ORAL_TABLET | Freq: Every day | ORAL | 0 refills | Status: AC | PRN
  Filled 2023-10-16: qty 14, 14d supply, fill #0

## 2023-10-16 NOTE — Telephone Encounter (Signed)
I tried calling her, but no answer. I left a message. Vomiting can be a common side effect with flagyl, especially if it combined with alcohol. I would advise her to stop the flagyl and would expect the vomiting to improve shortly after. Dr. Annie Paras can perhaps help [place] follow up with the patient tomorrow as well.

## 2023-10-16 NOTE — Telephone Encounter (Signed)
 Please address.

## 2023-10-16 NOTE — Telephone Encounter (Signed)
I spoke with patient. She is feeling better after stopping cymbalta. She tolerated the full course of flagyl. No alcohol use. She did describe a break out rash, but unsure if they were hives. I have discontinued the cymbalta and added it to her allergy list. I replaced it with sertraline 50mg  daily, and she also found occasional robaxin helpful for her back pain.

## 2023-12-25 ENCOUNTER — Ambulatory Visit: Payer: Self-pay | Admitting: Student

## 2023-12-27 ENCOUNTER — Other Ambulatory Visit (HOSPITAL_COMMUNITY): Payer: Self-pay

## 2023-12-27 ENCOUNTER — Ambulatory Visit: Payer: Self-pay

## 2023-12-27 NOTE — Telephone Encounter (Signed)
 Chief Complaint: Bilateral ankle and feet swelling Symptoms: ankle swelling Frequency: 3 days Pertinent Negatives: Patient denies chest pain, shortness of breath Disposition: [] ED /[] Urgent Care (no appt availability in office) / [x] Appointment(In office/virtual)/ []  [place] [facility]/ [] Home Care/ [] Refused Recommended Disposition /[] [place] [facility]/ []  Follow-up with PCP Additional Notes: Patient called in stating that she is experiencing bilateral ankle swelling that she experienced two weeks ago. Patient denies pain in calves, chest pain, difficulty breathing. Patient denies known cause, but wonders if it's due to increased sitting at work. Patient describes it as severe swelling. Patient appt made for further evaluation.    Copied from CRM [id number]. Topic: Clinical - Red Word Triage >> Dec 27, 2023  4:39 PM Adrianna P wrote: Red Word that prompted transfer to Nurse Triage: patient has had swelling for 3 days in both legs Reason for Disposition  MILD or MODERATE ankle swelling (e.g., can't move joint normally, can't do usual activities) (Exceptions: Itchy, localized swelling; swelling is chronic.)  Answer Assessment - Initial Assessment Questions 1. LOCATION: "Which ankle is swollen?" "Where is the swelling?"     Bilateral ankle 2. ONSET: "When did the swelling start?"     3 days 3. SWELLING: "How bad is the swelling?" Or, "How large is it?" (e.g., mild, moderate, severe; size of localized swelling)    - NONE: No joint swelling.   - LOCALIZED: Localized; small area of puffy or swollen skin (e.g., insect bite, skin irritation).   - MILD: Joint looks or feels mildly swollen or puffy.   - MODERATE: Swollen; interferes with normal activities (e.g., work or school); decreased range of movement; may be limping.   - SEVERE: Very swollen; can't move swollen joint at all; limping a lot or unable to walk.     "Severe" but patient states she can walk around 4. PAIN: "Is there any  pain?" If Yes, ask: "How bad is it?" (Scale 1-10; or mild, moderate, severe)   - NONE (0): no pain.   - MILD (1-3): doesn't interfere with normal activities.    - MODERATE (4-7): interferes with normal activities (e.g., work or school) or awakens from sleep, limping.    - SEVERE (8-10): excruciating pain, unable to do any normal activities, unable to walk.      No 5. CAUSE: "What do you think caused the ankle swelling?"     Patient uncertain, maybe immobilization 6. OTHER SYMPTOMS: "Do you have any other symptoms?" (e.g., fever, chest pain, difficulty breathing, calf pain)     Tingling in feet 7. PREGNANCY: "Is there any chance you are pregnant?" "When was your last menstrual period?"     No  Protocols used: Ankle Swelling-A-AH

## 2023-12-31 ENCOUNTER — Ambulatory Visit (INDEPENDENT_AMBULATORY_CARE_PROVIDER_SITE_OTHER): Payer: Self-pay | Admitting: Student

## 2023-12-31 VITALS — BP 133/84 | HR 64 | Temp 97.9°F | Ht 59.0 in | Wt 192.6 lb

## 2023-12-31 DIAGNOSIS — M7989 Other specified soft tissue disorders: Secondary | ICD-10-CM

## 2023-12-31 NOTE — Assessment & Plan Note (Addendum)
 Suspect venous insufficiency in this otherwise healthy young person with obesity who has sedentary lifestyle.  Doubt hepatic fibrosis or portal hypertension.  Doubt nephrotic syndrome.  Doubt myxedema from hypothyroid.  This is not heart failure.  Will get a CMP and UA today.  Because of neck fullness I will check a TSH although I do not think she has goiter.  Recommend compression stockings.

## 2023-12-31 NOTE — Patient Instructions (Addendum)
 Try some strong compression stockings that go up to your knees to reduce leg swelling.  In the meantime we'll check for some underlying causes of leg swelling with blood tests.  Remember to bring all of the medications that you take (including over the counter medications and supplements) with you to every clinic visit.  This after visit summary is an important review of tests, referrals, and medication changes that were discussed during your visit. If you have questions or concerns, call [phone]. Outside of clinic business hours, call the main hospital at [phone] and ask the operator for the on-call internal medicine resident.   Adria Hopkins MD 12/31/2023, 4:40 PM

## 2023-12-31 NOTE — Progress Notes (Signed)
 Patient name: Shelby Pittman Date of birth: 1996-05-22 Date of visit: 12/31/23  Subjective  Chief Complaint  Patient presents with   Joint Swelling    Foot and ankle swelling for about 3 days she has kept her legs elevated and some of the swelling went down    HPI Shelby Pittman is here for leg swelling.  Ongoing for a couple of weeks. Associated with working from home, sitting a lot. Non painful. Swelling got better with elevation.   ROS Energy level is good. Has chronic trouble sleeping. Appetite is good, eating more than normally. Weight is up 15-20 lbs since last month. No nausea, vomiting, upset stomach, diarrhea, constipation. No problems urinating, no foamy urine. No abdominal pain.  Patient Active Problem List   Diagnosis Date Noted   Leg swelling 12/31/2023   Unprotected sex 10/04/2023   Routine screening for STI (sexually transmitted infection) 10/02/2022   Infertility, female 10/05/2021   Back pain 05/17/2020   Health care maintenance 04/03/2019   Obesity (BMI 30.0-34.9) 10/29/2018   Migraine 04/18/2017   Depression 03/14/2017   Asthma 09/28/2016   Allergic rhinitis 09/28/2016   Past Medical History:  Diagnosis Date   Asthma    Injury of face 01/10/2023   Migraine    Nasal turbinate hypertrophy 08/07/2018   Referred otalgia of both ears 08/07/2018   Uses contraceptive implants as primary birth control method 09/28/2016   Outpatient Encounter Medications as of 12/31/2023  Medication Sig   albuterol  (VENTOLIN  HFA) 108 (90 Base) MCG/ACT inhaler Inhale 2 puffs into the lungs every 6 (six) hours as needed for wheezing/shortness of breath   budesonide -formoterol  (SYMBICORT ) 80-4.5 MCG/ACT inhaler Inhale 2 puffs into the lungs daily.   cetirizine  (ZYRTEC ) 10 MG tablet Take 1 tablet (10 mg total) by mouth daily. IM program.   fluticasone  (FLONASE ) 50 MCG/ACT nasal spray Place 1 spray into both nostrils daily.   Ibuprofen  (ADVIL ) 200 MG CAPS Take 4 capsules (800  mg total) by mouth in the morning and at bedtime.   methocarbamol  (ROBAXIN -750) 750 MG tablet Take 1 tablet (750 mg total) by mouth daily as needed for muscle spasms.   Nasal Moisturizer Combination (OCEAN COMPLETE SINUS RINSE) AERS Place into the nose.   sertraline  (ZOLOFT ) 50 MG tablet Take 1 tablet (50 mg total) by mouth daily.   sodium chloride  (OCEAN) 0.65 % SOLN nasal spray Place 1 spray into both nostrils as needed for congestion.   topiramate  (TOPAMAX ) 25 MG tablet Take 1 tablet (25 mg total) by mouth daily.   No facility-administered encounter medications on file as of 12/31/2023.   Past Surgical History:  Procedure Laterality Date   FRACTURE SURGERY     Family History  Problem Relation Age of Onset   Hypertension Mother    Hypertension Other    Diabetes Other    Social History   Socioeconomic History   Marital status: Single    Spouse name: Not on file   Number of children: Not on file   Years of education: Not on file   Highest education level: Not on file  Occupational History   Not on file  Tobacco Use   Smoking status: Never   Smokeless tobacco: Never   Tobacco comments:    No cigarettes   Vaping Use   Vaping status: Never Used  Substance and Sexual Activity   Alcohol use: Yes   Drug use: Yes    Types: Marijuana   Sexual activity: Not on file  Other  Topics Concern   Not on file  Social History Narrative   Not on file   Social Drivers of Health   Financial Resource Strain: Not on file  Food Insecurity: No Food Insecurity (12/29/2022)   Hunger Vital Sign    Worried About Running Out of Food in the Last Year: Never true    Ran Out of Food in the Last Year: Never true  Transportation Needs: Not on file  Physical Activity: Not on file  Stress: Not on file  Social Connections: Not on file  Intimate Partner Violence: Not on file     Objective  Today's Vitals   12/31/23 1534  BP: 133/84  Pulse: 64  Temp: 97.9 F (36.6 C)  TempSrc: Oral  SpO2: 100%   Weight: 192 lb 9.6 oz (87.4 kg)  Height: 4\' 11"  (1.499 m)  Body mass index is 38.9 kg/m.   Physical Exam Well-appearing No cervical lymphadenopathy Some fullness around thyroid Oral mucous membranes are moist Heart rate and rhythm are normal, radial pulse strong, bilateral lower extremity pitting edema Nontender abdomen, no hepatomegaly Skin is warm and dry Alert and oriented, speech normal, no facial asymmetry   Assessment & Plan  Problem List Items Addressed This Visit     Leg swelling - Primary   Suspect venous insufficiency in this otherwise healthy young person with obesity who has sedentary lifestyle.  Doubt hepatic fibrosis or portal hypertension.  Doubt nephrotic syndrome.  Doubt myxedema from hypothyroid.  This is not heart failure.  Will get a CMP and UA today.  Because of neck fullness I will check a TSH although I do not think she has goiter.  Recommend compression stockings.      Relevant Orders   TSH   CMP14 + Anion Gap   Compression stockings   Urinalysis, Reflex Microscopic   Return if symptoms worsen or fail to improve.  Adria Hopkins MD 12/31/2023, 5:24 PM

## 2024-01-01 ENCOUNTER — Other Ambulatory Visit: Payer: Self-pay | Admitting: Student

## 2024-01-01 ENCOUNTER — Telehealth: Payer: Self-pay | Admitting: *Deleted

## 2024-01-01 DIAGNOSIS — E8809 Other disorders of plasma-protein metabolism, not elsewhere classified: Secondary | ICD-10-CM

## 2024-01-01 LAB — CMP14 + ANION GAP
ALT: 17 IU/L (ref 0–32)
AST: 17 IU/L (ref 0–40)
Albumin: 3.7 g/dL — ABNORMAL LOW (ref 4.0–5.0)
Alkaline Phosphatase: 27 IU/L — ABNORMAL LOW (ref 44–121)
Anion Gap: 16 mmol/L (ref 10.0–18.0)
BUN/Creatinine Ratio: 13 (ref 9–23)
BUN: 10 mg/dL (ref 6–20)
Bilirubin Total: 0.3 mg/dL (ref 0.0–1.2)
CO2: 20 mmol/L (ref 20–29)
Calcium: 9 mg/dL (ref 8.7–10.2)
Chloride: 103 mmol/L (ref 96–106)
Creatinine, Ser: 0.76 mg/dL (ref 0.57–1.00)
Globulin, Total: 1.7 g/dL (ref 1.5–4.5)
Glucose: 83 mg/dL (ref 70–99)
Potassium: 4.6 mmol/L (ref 3.5–5.2)
Sodium: 139 mmol/L (ref 134–144)
Total Protein: 5.4 g/dL — ABNORMAL LOW (ref 6.0–8.5)
eGFR: 109 mL/min/{1.73_m2} (ref >59)

## 2024-01-01 LAB — URINALYSIS, ROUTINE W REFLEX MICROSCOPIC
Bilirubin, UA: NEGATIVE
Glucose, UA: NEGATIVE
Ketones, UA: NEGATIVE
Leukocytes,UA: NEGATIVE
Nitrite, UA: NEGATIVE
Protein,UA: NEGATIVE
RBC, UA: NEGATIVE
Specific Gravity, UA: 1.016 (ref 1.005–1.030)
Urobilinogen, Ur: 0.2 mg/dL (ref 0.2–1.0)
pH, UA: 6.5 (ref 5.0–7.5)

## 2024-01-01 LAB — TSH: TSH: 1.05 u[IU]/mL (ref 0.450–4.500)

## 2024-01-01 NOTE — Progress Notes (Signed)
 Internal Medicine Clinic Attending  Case discussed with the resident at the time of the visit.  We reviewed the resident's history and exam and pertinent patient test results.  I agree with the assessment, diagnosis, and plan of care documented in the resident's note.

## 2024-01-01 NOTE — Telephone Encounter (Signed)
 Copied from CRM [id number]. Topic: Clinical - Lab/Test Results >> Dec 31, 2023  5:24 PM Corin V wrote: Reason for CRM: Patient is wanting to know if her blood draw from today is able to be used to test her A1C as well. Please let he know if a new lab needs to be done for A1C.

## 2024-01-02 ENCOUNTER — Other Ambulatory Visit: Payer: Self-pay | Admitting: Student

## 2024-01-02 ENCOUNTER — Encounter: Payer: Self-pay | Admitting: Student

## 2024-01-02 ENCOUNTER — Other Ambulatory Visit: Payer: Self-pay

## 2024-01-02 DIAGNOSIS — M7989 Other specified soft tissue disorders: Secondary | ICD-10-CM

## 2024-01-02 DIAGNOSIS — E66811 Obesity, class 1: Secondary | ICD-10-CM

## 2024-01-02 LAB — BRAIN NATRIURETIC PEPTIDE: B Natriuretic Peptide: 60.4 pg/mL (ref 0.0–100.0)

## 2024-01-02 LAB — HEMOGLOBIN A1C
Hgb A1c MFr Bld: 5 % (ref 4.8–5.6)
Mean Plasma Glucose: 96.8 mg/dL

## 2024-01-02 NOTE — Addendum Note (Signed)
 Addended by: Manfred Seed on: 01/02/2024 03:59 PM   Modules accepted: Orders

## 2024-01-03 ENCOUNTER — Ambulatory Visit: Payer: Self-pay

## 2024-01-03 ENCOUNTER — Encounter: Payer: Self-pay | Admitting: Student

## 2024-01-03 NOTE — Telephone Encounter (Signed)
  Chief Complaint: Edema Symptoms: Edema Frequency: Constant Pertinent Negatives: Patient denies other symptoms Disposition: [] ED /[] Urgent Care (no appt availability in office) / [] Appointment(In office/virtual)/ []  [place] [facility]/ [] Home Care/ [] Refused Recommended Disposition /[] [place] [facility]/ [x]  Follow-up with PCP Additional Notes:  Patient calling back, she read the messages from Dr. Abner Hoffman, she cannot tolerate the swelling. She has been elevating her feet and it is not resolving, she states she "doesn't want to be sitting all the time", Her 'sister is a nurse' and told her "compression stocking don't really work for most people", [place] states she will try some but does not have the funds right now, she will purchase them when she gets paid. She states yes, her weight goes up and down but never has caused fluid build up. Would still like to start 'fluid pills". She also has questions regarding her recent her lab work, would like to discuss thyroid testing. Requesting call back from PCP to discuss lab work and how she is going to manage her swelling during sandel season.    Copied from CRM [id number]. Topic: Clinical - Medical Advice >> Jan 03, 2024  4:10 PM Adrianna P wrote: Reason for CRM: Patient wants to know if she can be prescribed fluid pills for swelling and feet and ankles    Reason for Disposition . [1] Follow-up call from patient regarding patient's clinical status AND [2] information NON-URGENT  Protocols used: PCP Call - No Triage-A-AH

## 2024-01-03 NOTE — Telephone Encounter (Signed)
 Copied from CRM [id number]. Topic: Clinical - Medical Advice >> Jan 03, 2024  4:10 PM Adrianna P wrote: Reason for CRM: Patient wants to know if she can be prescribed fluid pills for swelling and feet and ankles

## 2024-01-07 ENCOUNTER — Other Ambulatory Visit (HOSPITAL_COMMUNITY): Payer: Self-pay

## 2024-01-17 ENCOUNTER — Ambulatory Visit: Payer: Self-pay

## 2024-01-17 NOTE — Telephone Encounter (Signed)
 Copied from CRM [id number]. Topic: Clinical - Medical Advice >> Jan 17, 2024  5:11 PM Adrianna P wrote: Reason for CRM: pt took a at home uti test showing leukocytes abnormal. Please contact pt at [phone]   Chief Complaint: Vaginal discharge  Symptoms: White vaginal discharge, some vaginal irritation  Frequency: Intermittent  Pertinent Negatives: Patient denies any rash to the area  Disposition: [] ED /[x] Urgent Care (no appt availability in office) / [] Appointment(In office/virtual)/ []  [place] [facility]/ [x] Home Care/ [] Refused Recommended Disposition /[] [place] [facility]/ []  Follow-up with PCP Additional Notes: Patient called to report that she has been experiencing white vaginal discharge for a couple of days. She states that with the discharge she is also experiencing some irritation to her vaginal area. She denies any rash or foul odor to the discharge. Patient requesting an appointment and was advised there are no appointments until next week. Patient will go to urgent care for evaluation of her symptoms. Patient instructed to call back for new or worsening symptoms. Patient verbalized understanding and agreement with this plan.     Reason for Disposition  Symptoms of a vaginal yeast infection (i.e., white, thick, cottage-cheese-like, itchy, not bad smelling discharge)  Answer Assessment - Initial Assessment Questions 1. DISCHARGE: "Describe the discharge." (e.g., white, yellow, green, gray, foamy, cottage cheese-like)     White, thick  2. ODOR: "Is there a bad odor?"     No 3. ONSET: "When did the discharge begin?"     A few days ago  4. RASH: "Is there a rash in the genital area?" If Yes, ask: "Describe it." (e.g., redness, blisters, sores, bumps)     No 5. ABDOMEN PAIN: "Are you having any abdomen pain?" If Yes, ask: "What does it feel like? " (e.g., crampy, dull, intermittent, constant)      No 6. ABDOMEN PAIN SEVERITY: If present, ask: "How bad is it?" (e.g., Scale  1-10; mild, moderate, or severe)   - MILD (1-3): Doesn't interfere with normal activities, abdomen soft and not tender to touch.    - MODERATE (4-7): Interferes with normal activities or awakens from sleep, abdomen tender to touch.    - SEVERE (8-10): Excruciating pain, doubled over, unable to do any normal activities. (R/O peritonitis)      0/10 7. CAUSE: "What do you think is causing the discharge?" "Have you had the same problem before? What happened then?"     Possible yeast infection  8. OTHER SYMPTOMS: "Do you have any other symptoms?" (e.g., fever, itching, vaginal bleeding, pain with urination, injury to genital area, vaginal foreign body)     Vaginal irritation  Protocols used: Vaginal Discharge-A-AH

## 2024-01-18 NOTE — Telephone Encounter (Signed)
 We have 1 appt this am @ 1100AM - called pt to see if she would be able to come in. Stated she did not go to UC and she has to be at work this am @ 1045.

## 2024-01-22 ENCOUNTER — Encounter: Payer: Self-pay | Admitting: Internal Medicine

## 2024-01-22 ENCOUNTER — Telehealth: Payer: Self-pay | Admitting: *Deleted

## 2024-01-22 NOTE — Telephone Encounter (Signed)
 RTC to patient scheduled for 3:45 PM on 01/24/2024.

## 2024-01-22 NOTE — Telephone Encounter (Signed)
 RTC to patient unable to come for appointment this am.  Would like call if any available afternoons this week.  Copied from CRM [id number]. Topic: Appointments - Appointment Scheduling >> Jan 22, 2024  8:42 AM Shelby Dessert H wrote: Patient wants to be seen today because she thinks she has a yeast infection but is on her cycle, could you call the patient back to make an appointment at [phone] or let me know if she can be seen and ill call back to make appointment

## 2024-01-24 ENCOUNTER — Ambulatory Visit: Payer: Self-pay | Admitting: Student

## 2024-01-24 ENCOUNTER — Other Ambulatory Visit: Payer: Self-pay

## 2024-01-24 ENCOUNTER — Other Ambulatory Visit (HOSPITAL_COMMUNITY)
Admission: RE | Admit: 2024-01-24 | Discharge: 2024-01-24 | Disposition: A | Discharge status: Home / Self Care | Payer: Self-pay | Source: Ambulatory Visit | Attending: Internal Medicine | Admitting: Internal Medicine

## 2024-01-24 VITALS — BP 128/64 | HR 77 | Temp 98.1°F | Ht 59.0 in | Wt 193.4 lb

## 2024-01-24 DIAGNOSIS — Z113 Encounter for screening for infections with a predominantly sexual mode of transmission: Secondary | ICD-10-CM | POA: Insufficient documentation

## 2024-01-24 DIAGNOSIS — Z7251 High risk heterosexual behavior: Secondary | ICD-10-CM

## 2024-01-24 DIAGNOSIS — J4540 Moderate persistent asthma, uncomplicated: Secondary | ICD-10-CM

## 2024-01-24 MED ORDER — FLUTICASONE-SALMETEROL 45-21 MCG/ACT IN AERO
2.0000 | INHALATION_SPRAY | Freq: Two times a day (BID) | RESPIRATORY_TRACT | 12 refills | Status: DC
  Filled 2024-01-24: qty 12, 30d supply, fill #0

## 2024-01-24 NOTE — Patient Instructions (Signed)
 Thank you, Shelby Pittman for allowing us  to provide your care today.   I have ordered the following labs for you:   Lab Orders         HIV antibody (with reflex)      I have ordered the following medication/changed the following medications:    Start the following medications: Meds ordered this encounter  Medications   fluticasone -salmeterol (ADVAIR HFA) 45-21 MCG/ACT inhaler    Sig: Inhale 2 puffs into the lungs 2 (two) times daily.    Dispense:  12 g    Refill:  12    IMTP   Remember: 3 months as needed   Should you have any questions or concerns please call the internal medicine clinic at [phone]     Jose Ngo, MD [facility]

## 2024-01-24 NOTE — Progress Notes (Unsigned)
 CC:  Chief Complaint  Patient presents with   Vaginitis   Urinary Tract Infection   HPI:  [name] is a 28 y.o. female living with a history stated below and presents today for the above. Please see problem based assessment and plan for additional details.  Past Medical History:  Diagnosis Date   Asthma    Injury of face 01/10/2023   Migraine    Nasal turbinate hypertrophy 08/07/2018   Referred otalgia of both ears 08/07/2018   Uses contraceptive implants as primary birth control method 09/28/2016    Current Outpatient Medications on File Prior to Visit  Medication Sig Dispense Refill   albuterol  (VENTOLIN  HFA) 108 (90 Base) MCG/ACT inhaler Inhale 2 puffs into the lungs every 6 (six) hours as needed for wheezing/shortness of breath 6.7 g 1   cetirizine  (ZYRTEC ) 10 MG tablet Take 1 tablet (10 mg total) by mouth daily. IM program. 90 tablet 11   fluticasone  (FLONASE ) 50 MCG/ACT nasal spray Place 1 spray into both nostrils daily. 16 g 4   Ibuprofen  (ADVIL ) 200 MG CAPS Take 4 capsules (800 mg total) by mouth in the morning and at bedtime. 120 capsule 0   methocarbamol  (ROBAXIN -750) 750 MG tablet Take 1 tablet (750 mg total) by mouth daily as needed for muscle spasms. 14 tablet 0   Nasal Moisturizer Combination (OCEAN COMPLETE SINUS RINSE) AERS Place into the nose.     sertraline  (ZOLOFT ) 50 MG tablet Take 1 tablet (50 mg total) by mouth daily. 90 tablet 3   sodium chloride  (OCEAN) 0.65 % SOLN nasal spray Place 1 spray into both nostrils as needed for congestion. 15 mL 0   topiramate  (TOPAMAX ) 25 MG tablet Take 1 tablet (25 mg total) by mouth daily. 30 tablet 3   No current facility-administered medications on file prior to visit.    Family History  Problem Relation Age of Onset   Hypertension Mother    Hypertension Other    Diabetes Other     Social History   Socioeconomic History   Marital status: Single    Spouse name: Not on file   Number of children:  Not on file   Years of education: Not on file   Highest education level: Not on file  Occupational History   Not on file  Tobacco Use   Smoking status: Never   Smokeless tobacco: Never   Tobacco comments:    No cigarettes   Vaping Use   Vaping status: Never Used  Substance and Sexual Activity   Alcohol use: Yes   Drug use: Yes    Types: Marijuana   Sexual activity: Not on file  Other Topics Concern   Not on file  Social History Narrative   Not on file   Social Drivers of Health   Financial Resource Strain: Not on file  Food Insecurity: No Food Insecurity (12/29/2022)   Hunger Vital Sign    Worried About Running Out of Food in the Last Year: Never true    Ran Out of Food in the Last Year: Never true  Transportation Needs: Not on file  Physical Activity: Not on file  Stress: Not on file  Social Connections: Not on file  Intimate Partner Violence: Not on file    Review of Systems: ROS negative except for what is noted on the assessment and plan.  Vitals:   01/24/24 1609  BP: 128/64  Pulse: 77  Temp: 98.1 F (36.7 C)  TempSrc: Oral  SpO2:  100%  Weight: 193 lb 6.4 oz (87.7 kg)  Height: 4\' 11"  (1.499 m)    Physical Exam: Constitutional: well-appearing, in NAD  HENT: normocephalic atraumatic, mucous membranes moist, no white plaques in tongue, no rhinorrhea Eyes: conjunctiva non-erythematous Cardiovascular: regular rate and rhythm, no m/r/g Pulmonary/Chest: normal work of breathing on room air, lungs clear to auscultation bilaterally Abdominal: soft, non-tender, non-distended MSK: normal bulk and tone Neurological: alert & oriented x 3, no focal deficit Skin: warm and dry Psych: normal mood and behavior  Assessment & Plan:   Patient was discussed with Dr. Lelia Putnam  Unprotected sex Patient is sexually active with 2 female partners at this time and not using contraception. She is not quite sure about the sexual habits of one of them and would like to get tested  for HIV again. Pt does not have insurance and is aware that she would need pay for its out of pocket. She is asymptomatic and recently tested three months ago. She denies having abdominal pain, non jaundiced, and has not had any other rashes or genital ulcers. She is not concerned for syphilis or hepatitis. She is due for hepatitis C screening. Would benefit from hepatitis testing this once she gets insurance in November. She does have a vaginal discharge that is irritating and mildly itchy. She tried a wash over the counter as well as monistat. Her discharge was white and now is clear. No burning, dysuria, no CVA, no suprapubic tenderness, no fevers, N/V. No urinary retention. No polyuria. She is not interested in using contraception at this time. She states she is trying to get pregnant. 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   -Cervicovaginal swab G/C trichomonas, BV, Candida -HIV testing   Asthma Has a history of moderate persistent asthma and her Symbicort  was $200. I will send a prescription for Advair through the IMTP program to hold her over until she applies for new insurance in November.   -Start Advair  -cw albuterol  PRN  Jose Ngo, MD [facility] Internal Medicine, PGY-1 Phone: [phone] Date 01/25/2024 Time 9:57 AM

## 2024-01-25 LAB — HIV ANTIBODY (ROUTINE TESTING W REFLEX): HIV Screen 4th Generation wRfx: NONREACTIVE

## 2024-01-25 NOTE — Assessment & Plan Note (Signed)
 Has a history of moderate persistent asthma and her Symbicort  was $200. I will send a prescription for Advair through the IMTP program to hold her over until she applies for new insurance in November.   -Start Advair  -cw albuterol  PRN

## 2024-01-25 NOTE — Assessment & Plan Note (Addendum)
 Patient is sexually active with 2 female partners at this time and not using contraception. She is not quite sure about the sexual habits of one of them and would like to get tested for HIV again. Pt does not have insurance and is aware that she would need pay for its out of pocket. She is asymptomatic and recently tested three months ago. She denies having abdominal pain, non jaundiced, and has not had any other rashes or genital ulcers. She is not concerned for syphilis or hepatitis. She is due for hepatitis C screening. Would benefit from hepatitis testing this once she gets insurance in November. She does have a vaginal discharge that is irritating and mildly itchy. She tried a wash over the counter as well as monistat. Her discharge was white and now is clear. No burning, dysuria, no CVA, no suprapubic tenderness, no fevers, N/V. No urinary retention. No polyuria. She is not interested in using contraception at this time. She states she is trying to get pregnant. 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   -Cervicovaginal swab G/C trichomonas, BV, Candida -HIV testing

## 2024-01-26 ENCOUNTER — Telehealth: Payer: Self-pay | Admitting: Nurse Practitioner

## 2024-01-26 ENCOUNTER — Ambulatory Visit: Payer: Self-pay | Admitting: Student

## 2024-01-26 NOTE — Progress Notes (Signed)
 Internal Medicine Clinic Attending  Case discussed with the resident at the time of the visit.  We reviewed the resident's history and exam and pertinent patient test results.  I agree with the assessment, diagnosis, and plan of care documented in the resident's note.

## 2024-01-28 ENCOUNTER — Telehealth: Payer: Self-pay

## 2024-01-28 ENCOUNTER — Other Ambulatory Visit: Payer: Self-pay

## 2024-01-28 ENCOUNTER — Other Ambulatory Visit (HOSPITAL_COMMUNITY): Payer: Self-pay

## 2024-01-28 DIAGNOSIS — J4540 Moderate persistent asthma, uncomplicated: Secondary | ICD-10-CM

## 2024-01-28 LAB — CERVICOVAGINAL ANCILLARY ONLY
Bacterial Vaginitis (gardnerella): POSITIVE — AB
Candida Glabrata: NEGATIVE
Candida Vaginitis: NEGATIVE
Chlamydia: NEGATIVE
Comment: NEGATIVE
Comment: NEGATIVE
Comment: NEGATIVE
Comment: NEGATIVE
Comment: NEGATIVE
Comment: NORMAL
Neisseria Gonorrhea: NEGATIVE
Trichomonas: NEGATIVE

## 2024-01-28 MED ORDER — ALBUTEROL SULFATE HFA 108 (90 BASE) MCG/ACT IN AERS: 2.0000 | INHALATION_SPRAY | Freq: Four times a day (QID) | RESPIRATORY_TRACT | 3 refills | Status: DC | PRN

## 2024-01-28 MED ORDER — ALBUTEROL SULFATE HFA 108 (90 BASE) MCG/ACT IN AERS
2.0000 | INHALATION_SPRAY | Freq: Four times a day (QID) | RESPIRATORY_TRACT | 3 refills | Status: DC | PRN
  Filled 2024-01-28: qty 6.7, 25d supply, fill #0

## 2024-01-28 NOTE — Telephone Encounter (Signed)
 Error

## 2024-01-28 NOTE — Addendum Note (Signed)
 Addended by: Rolena Knutson on: 01/28/2024 11:18 AM   Modules accepted: Orders

## 2024-01-28 NOTE — Telephone Encounter (Signed)
 Patient called she stated she received her lab results on mychart about her HIV result coming back as negative. Patient also stated she has not received a antibiotic as she is still having vaginal itching and irritation. I advised the patient that the testing for GC,Trich,BV etc. Is still in process, we have not received the result yet. Please return patients call.  Pharmacy:[facility]

## 2024-01-29 ENCOUNTER — Ambulatory Visit: Payer: Self-pay

## 2024-01-29 NOTE — Telephone Encounter (Signed)
 Patient inquiring about metronidazole  for her BV.- pt recently tested positive.   Patient reports s/s are better, but still bothersome.  Pt would like meds to be sent to in-house pharmacy d/t current lapse in insurance.  See note below for additional info.  Message routed appropriately.    Copied from CRM [id number]. Topic: Clinical - Lab/Test Results >> Jan 29, 2024 12:20 PM Tisa Forester wrote: Reason for CRM: calling regarding her test results , the Bacterial Vaginitis (gardnerella) came back positive , patient is aware of her test results have questions  Want to know if the provider patient seen [place] ,[place]  will be sending in the same antibiotics patient had the last time  regards to test results  Call back number [phone]

## 2024-01-30 ENCOUNTER — Other Ambulatory Visit: Payer: Self-pay | Admitting: Internal Medicine

## 2024-01-30 ENCOUNTER — Other Ambulatory Visit: Payer: Self-pay

## 2024-01-30 DIAGNOSIS — B9689 Other specified bacterial agents as the cause of diseases classified elsewhere: Secondary | ICD-10-CM

## 2024-01-30 MED ORDER — METRONIDAZOLE 500 MG PO TABS
500.0000 mg | ORAL_TABLET | Freq: Two times a day (BID) | ORAL | 0 refills | Status: AC
  Filled 2024-01-30: qty 14, 7d supply, fill #0

## 2024-01-30 NOTE — Telephone Encounter (Signed)
 Please address.

## 2024-01-30 NOTE — Telephone Encounter (Signed)
 Patient called regarding the previous message. Patient is requesting a antibiotic to be sent to the pharmacy asap.

## 2024-01-30 NOTE — Progress Notes (Signed)
 Returned patient's call. Discussed results of cervicovaginal swab which  was positive for BV. Sent flagyl  500 mg BID for 7 days. Currently not sexually active so no need for partner therapy.    Jackolyn Masker, MD Tom[facility] Fragmin. [facility], PGY-3

## 2024-02-05 ENCOUNTER — Telehealth: Payer: Self-pay | Admitting: *Deleted

## 2024-02-05 NOTE — Telephone Encounter (Unsigned)
 RTC from patient.  Missed call from Dr. Jari Merles.  Will await call from Dr. Zheng today. Copied from CRM [id number]. Topic: Clinical - Medication Question >> Feb 05, 2024  9:52 AM Shelby Dessert H wrote: Reason for CRM: Patient called because she thinks the medicine metroNIDAZOLE  (FLAGYL ) 500 MG tablet caused her to have a yeast infection, she would like the provider to send something to the pharmacy for the yeast infection to [facility] [address], [address] [place] [address] Phone: [phone] Fax: [phone] Hours: M-F 7:30a-6:00p

## 2024-02-06 ENCOUNTER — Ambulatory Visit: Payer: Self-pay

## 2024-02-06 NOTE — Telephone Encounter (Signed)
 FYI Only or Action Required?: Action required by provider  Patient was last seen in primary care on 01/24/2024 by Jose Ngo, MD. Called Nurse Triage reporting Vaginal Itching. Symptoms began several days ago. Interventions attempted: OTC medications: Monistat. Symptoms are: gradually worsening.  Triage Disposition: See Physician Within 24 Hours  Patient/caregiver understands and will follow disposition?: No, wishes to speak with PCP                 Reason for Disposition  MODERATE-SEVERE itching (i.e., interferes with school, work, or sleep)  Answer Assessment - Initial Assessment Questions States she is taking metronidazole  for BV and has 2 days left. Now states she has a yeast infection. Pt states she read that metronidazole  can cause a yeast infection. Pt declines coming in for an office visit.  1. SYMPTOM: What's the main symptom you're concerned about? (e.g., pain, itching, dryness)     Itching, discomfort 3. ONSET: When did the itching start?     2 days ago 4. PAIN: Is there any pain? If Yes, ask: How bad is it? (Scale: 1-10; mild, moderate, severe)   -  MILD (1-3): Doesn't interfere with normal activities.    -  MODERATE (4-7): Interferes with normal activities (e.g., work or school) or awakens from sleep.     -  SEVERE (8-10): Excruciating pain, unable to do any normal activities.     It's not pain, it's just discomfort, pain is not that bad 5. ITCHING: Is there any itching? If Yes, ask: How bad is it? (Scale: 1-10; mild, moderate, severe)     yes 6. CAUSE: What do you think is causing the discharge? Have you had the same problem before? What happened then?     Yeast infection 7. OTHER SYMPTOMS: Do you have any other symptoms? (e.g., fever, itching, vaginal bleeding, pain with urination, injury to genital area, vaginal foreign body)   Taking metronidazole  for BV, has 2 days left. Seen 5/29, diagnosed with BV  Itching,  discomfort, yellow/white discharge -- using Monistat, baby powder   Denies dysuria, denies abd pain  Protocols used: Vaginal Symptoms-A-AH

## 2024-02-06 NOTE — Telephone Encounter (Signed)
     Copied From CRM [id number]. Reason for Triage: Patient is currently under the assumption she is having a yeast infection: She is currently having itching and some discomfort in the lower area.. Patients call back # is [phone]

## 2024-02-07 MED ORDER — FLUCONAZOLE 150 MG PO TABS
150.0000 mg | ORAL_TABLET | Freq: Once | ORAL | 0 refills | Status: AC
  Filled 2024-02-07: qty 1, 1d supply, fill #0

## 2024-02-07 NOTE — Telephone Encounter (Addendum)
 Patient called and says she missed a call from the doctor yesterday and the message said someone would call today, but she never received a call. She says that is unprofessional to say one thing and do something else. She says she's itching really bad and discharge and needs something called in for a yeast infection. She's tried OTC Monistat and it didn't work. Advised I will send this to the office for the provider to review and someone will see this tomorrow. She verbalized understanding.    Copied from CRM [id number]. Topic: Clinical - Pink Word Triage >> Feb 06, 2024 11:59 AM Karole Pacer C wrote: Reason for Triage: Patient is currently under the assumption she is having a yeast infection: She is currently having itching and some discomfort in the lower area.. Patients call back # is [phone] >> Feb 07, 2024  5:02 PM Tiffany H wrote: Patient called having not heard back from provider about medication change. Please assist.

## 2024-02-08 ENCOUNTER — Other Ambulatory Visit: Payer: Self-pay

## 2024-03-20 ENCOUNTER — Ambulatory Visit: Payer: Self-pay

## 2024-03-20 NOTE — Progress Notes (Unsigned)
 Patient name: Shelby Pittman Date of birth: 12-06-1995 Date of visit: 03/21/24  Type of visit: Acute Office Visit  Subjective   Chief concern:  Chief Complaint  Patient presents with   Foot Swelling   Inhaler issue    Shelby S Erbes is a 28 y.o. female with a PMHx of asthma, allergic rhinitis, obesity, depression who presents to [facility] clinic for evaluation of feet swelling.   Patient Active Problem List   Diagnosis Date Noted   Encounter for screening involving social determinants of health (SDoH) 03/21/2024   Leg swelling 12/31/2023   Unprotected sex 10/04/2023   Routine screening for STI (sexually transmitted infection) 10/02/2022   Infertility, female 10/05/2021   Back pain 05/17/2020   Health care maintenance 04/03/2019   Obesity, Class II, BMI 35-39.9 10/29/2018   Migraine 04/18/2017   Depression 03/14/2017   Asthma 09/28/2016   Allergic rhinitis 09/28/2016     Past Surgical History:  Procedure Laterality Date   FRACTURE SURGERY      Review of Systems  Constitutional:  Positive for malaise/fatigue. Negative for weight loss.  Respiratory:  Positive for shortness of breath (episodic, not currently experiencing). Negative for cough, sputum production and wheezing.   Cardiovascular:  Positive for leg swelling (bilateral, chronic). Negative for chest pain.    Current Outpatient Medications  Medication Instructions   albuterol  (VENTOLIN  HFA) 108 (90 Base) MCG/ACT inhaler Inhale 2 puffs into the lungs every 6 (six) hours as needed for wheezing/shortness of breath   cetirizine  (ZYRTEC ) 10 mg, Oral, Daily, IM program.   fluticasone  (FLONASE ) 50 MCG/ACT nasal spray 1 spray, Each Nare, Daily   fluticasone -salmeterol (ADVAIR  HFA) 45-21 MCG/ACT inhaler 2 puffs, Inhalation, 2 times daily   Ibuprofen  (ADVIL ) 800 mg, Oral, 2 times daily   methocarbamol  (ROBAXIN -750) 750 mg, Oral, Daily PRN   Nasal Moisturizer Combination (OCEAN COMPLETE SINUS RINSE) AERS Nasal    sertraline  (ZOLOFT ) 50 mg, Oral, Daily   sodium chloride  (OCEAN) 0.65 % SOLN nasal spray 1 spray, Each Nare, As needed   topiramate  (TOPAMAX ) 25 mg, Oral, Daily    Social History   Tobacco Use   Smoking status: Never   Smokeless tobacco: Never   Tobacco comments:    No cigarettes   Vaping Use   Vaping status: Never Used  Substance Use Topics   Alcohol use: Yes   Drug use: Yes    Types: Marijuana      Objective  Today's Vitals   03/21/24 0839  BP: 118/84  Pulse: 71  Temp: 98.1 F (36.7 C)  TempSrc: Oral  SpO2: 98%  Weight: 195 lb 12.8 oz (88.8 kg)  Height: 4' 11 (1.499 m)  PainSc: 0-No pain  Body mass index is 39.55 kg/m.   Physical Exam: Constitutional: well-appearing, well-nourished; obese; no acute distress HENT: normocephalic atraumatic, mucous membranes moist Eyes: conjunctiva non-erythematous Cardiovascular: regular rate and rhythm, no m/r/g Pulmonary/Chest: normal work of breathing on room air, lungs clear to auscultation bilaterally Abdominal: soft, non-tender, non-distended MSK: normal bulk and tone Neurological: alert & oriented x 3, no focal deficit Skin: warm and dry Extremities: BLE with non-pitting edema noted to both feet. No wounds/skin changes are present over lower extremities. Psych: normal mood and behavior  Last CBC Lab Results  Component Value Date   WBC 8.0 03/29/2022   HGB 14.1 03/29/2022   HCT 42.0 03/29/2022   MCV 91.7 03/29/2022   MCH 30.8 03/29/2022   RDW 12.8 03/29/2022   PLT 261 03/29/2022  Last metabolic panel Lab Results  Component Value Date   GLUCOSE 83 12/31/2023   NA 139 12/31/2023   K 4.6 12/31/2023   CL 103 12/31/2023   CO2 20 12/31/2023   BUN 10 12/31/2023   CREATININE 0.76 12/31/2023   EGFR 109 12/31/2023   CALCIUM 9.0 12/31/2023   PROT 5.4 (L) 12/31/2023   ALBUMIN 3.7 (L) 12/31/2023   LABGLOB 1.7 12/31/2023   BILITOT 0.3 12/31/2023   ALKPHOS 27 (L) 12/31/2023   AST 17 12/31/2023   ALT 17 12/31/2023    ANIONGAP 13 03/29/2022   Last hemoglobin A1c Lab Results  Component Value Date   HGBA1C 5.0 01/02/2024   Last thyroid functions Lab Results  Component Value Date   TSH 1.050 12/31/2023        Assessment & Plan  Leg swelling Assessment & Plan: The patient is still having chronic leg swelling that is worse throughout the day.  She has tried compression stockings which she says does not help her very much.  She is requesting a medication to reduce leg swelling and says she will be going to a foot doctor.  Her leg swelling is better after waking up in the morning.  Patient saw Dr. Norrine on 12/31/2023 for the same problem.  Extensive workup was completed including CMP, TSH, urine protein analysis which were all unremarkable.  Heart and lung exams are normal.  Only positive exam finding includes nonpitting edema noted to both feet. Likely cause of her edema is venous insufficiency as other tests were nonrevealing.  The patient may benefit from a sleep study due to subjective complaints of waking up in the middle of the night short of breath and not feeling well rested and when awaking in the morning.  However the patient is uninsured and would not be able to afford this.  The patient's obesity/weight gain is likely a contributing factor to her symptoms and she would benefit greatly from losing weight. - Counseled the patient on diet and exercise to reduce weight.   Obesity, Class II, BMI 35-39.9 Assessment & Plan: Patient has significant weight gain of 20 pounds over the past year.  Patient's BMI is now 39.55.  She states that she eats 2 small meals but is still gaining weight.  Patient may benefit from GLP-1 agonist, however the patient is uninsured and this would be prohibitively expensive for her. - Counseled patient on lifestyle modifications to reduce weight gain   Encounter for screening involving social determinants of health [facility]) Assessment & Plan: The patient states that she  lost insurance at the beginning of the year.  She now works in a contracted position which does not offer benefits.  She states that she has applied for Medicaid but was told that she is not eligible until open enrollment period.  Her uninsured status is a barrier to addressing her medical problems. - Gave the patient resources for social work to Insurance risk surveyor for financial assistance   Moderate persistent asthma without complication Assessment & Plan: Patient has a history of moderate persistent asthma and was previously prescribed Symbicort  however this was too expensive for the patient because she is uninsured.  She was prescribed Advair  and never picked it up at the pharmacy because she said the pharmacy did not have it for her.  Upon chart review the prescription has already been ordered and instructed the patient to go pick up her Advair .  She was also having trouble with using albuterol  inhaler.  Instructed the patient  to ask the pharmacist for education regarding inhaler use. Patient notes occasional episodes of shortness of breath and wheezing.  Today she is not experiencing the symptoms in the office.  On exam the patient is in no acute distress, normal work of breathing, lungs CTAB.  Explained the importance of maintenance inhaler.  Patient describes history of marijuana smoking which she states that she needs to cut back on.  Emphasized the importance of reducing smoke inhalation of any form as this is likely exacerbating her symptoms.  The patient acknowledged. - Continue albuterol  as needed - Start Advair  - Counseled the patient on reducing smoking     Return in about 3 months (around 06/21/2024) for follow up leg swelling/asthma.   Patient discussed with Dr. Jeanelle, who also saw and evaluated the patient.  Mariadejesus Cade, MD [facility]  PGY-1 03/21/2024, 11:18 AM

## 2024-03-20 NOTE — Telephone Encounter (Signed)
 FYI Only or Action Required?: Action required by provider: clinical question for provider.  Patient was last seen in primary care on 01/24/2024 by Volney Leash, MD.  Called Nurse Triage reporting Nurse Triage.  Symptoms began Chronic.  Interventions attempted: Other: compression socks.  Symptoms are: unchanged.  Triage Disposition: No disposition on file.  Patient/caregiver understands and will follow disposition?:  Answer Assessment - Initial Assessment Questions 1. REASON FOR CALL or QUESTION: What is your reason for calling today? or How can I best     Patient calling requesting oral rx for her chronic foot edema, stating that she was advised to wear compression socks but that she cannot tolerate them after trying several pairs.  Protocols used: PCP Call - No Triage-A-AH Copied from CRM [id number]. Topic: Clinical - Red Word Triage >> Mar 20, 2024  2:46 PM Mercer PEDLAR wrote: Red Word that prompted transfer to Nurse Triage: Swollen feet.

## 2024-03-20 NOTE — Telephone Encounter (Signed)
RTC to patient .  Message left that the Clinics had called.

## 2024-03-21 ENCOUNTER — Ambulatory Visit: Payer: Self-pay

## 2024-03-21 ENCOUNTER — Other Ambulatory Visit: Payer: Self-pay

## 2024-03-21 VITALS — BP 118/84 | HR 71 | Temp 98.1°F | Ht 59.0 in | Wt 195.8 lb

## 2024-03-21 DIAGNOSIS — E66812 Obesity, class 2: Secondary | ICD-10-CM

## 2024-03-21 DIAGNOSIS — Z6839 Body mass index (BMI) 39.0-39.9, adult: Secondary | ICD-10-CM

## 2024-03-21 DIAGNOSIS — Z139 Encounter for screening, unspecified: Secondary | ICD-10-CM

## 2024-03-21 DIAGNOSIS — M7989 Other specified soft tissue disorders: Secondary | ICD-10-CM

## 2024-03-21 DIAGNOSIS — J4540 Moderate persistent asthma, uncomplicated: Secondary | ICD-10-CM

## 2024-03-21 NOTE — Assessment & Plan Note (Addendum)
 The patient is still having chronic leg swelling that is worse throughout the day.  She has tried compression stockings which she says does not help her very much.  She is requesting a medication to reduce leg swelling and says she will be going to a foot doctor.  Her leg swelling is better after waking up in the morning.  Patient saw Dr. Norrine on 12/31/2023 for the same problem.  Extensive workup was completed including CMP, TSH, urine protein analysis which were all unremarkable.  Heart and lung exams are normal.  Only positive exam finding includes nonpitting edema noted to both feet. Likely cause of her edema is venous insufficiency as other tests were nonrevealing.  The patient may benefit from a sleep study due to subjective complaints of waking up in the middle of the night short of breath and not feeling well rested and when awaking in the morning.  However the patient is uninsured and would not be able to afford this.  The patient's obesity/weight gain is likely a contributing factor to her symptoms and she would benefit greatly from losing weight. - Counseled the patient on diet and exercise to reduce weight.

## 2024-03-21 NOTE — Assessment & Plan Note (Addendum)
 Patient has a history of moderate persistent asthma and was previously prescribed Symbicort  however this was too expensive for the patient because she is uninsured.  She was prescribed Advair  and never picked it up at the pharmacy because she said the pharmacy did not have it for her.  Upon chart review the prescription has already been ordered and instructed the patient to go pick up her Advair .  She was also having trouble with using albuterol  inhaler.  Instructed the patient to ask the pharmacist for education regarding inhaler use. Patient notes occasional episodes of shortness of breath and wheezing.  Today she is not experiencing the symptoms in the office.  On exam the patient is in no acute distress, normal work of breathing, lungs CTAB.  Explained the importance of maintenance inhaler.  Patient describes history of marijuana smoking which she states that she needs to cut back on.  Emphasized the importance of reducing smoke inhalation of any form as this is likely exacerbating her symptoms.  The patient acknowledged. - Continue albuterol  as needed - Start Advair  - Counseled the patient on reducing smoking

## 2024-03-21 NOTE — Patient Instructions (Addendum)
 Thank you, Shelby Pittman for allowing us  to provide your care today. Today we discussed the following:  - Your leg swelling is likely due to venous sufficiency. You may benefit from losing weight. A diuretic medication to reduce leg swelling is not recommended without additional medical problems. - We have provided resources to contact [place] with social work to see if we can offer additional financial assistance. - Try your best to reduce your marijuana smoking as it may be contributing to your asthma symptoms.   Follow up: 3 months    Remember:   Should you have any questions or concerns please call the Internal Medicine Clinic at [phone].     Markeshia Giebel, MD [facility]

## 2024-03-21 NOTE — Assessment & Plan Note (Addendum)
 The patient states that she lost insurance at the beginning of the year.  She now works in a contracted position which does not offer benefits.  She states that she has applied for Medicaid but was told that she is not eligible until open enrollment period.  Her uninsured status is a barrier to addressing her medical problems. - Gave the patient resources for social work to Insurance risk surveyor for financial assistance

## 2024-03-21 NOTE — Assessment & Plan Note (Signed)
 Patient has significant weight gain of 20 pounds over the past year.  Patient's BMI is now 39.55.  She states that she eats 2 small meals but is still gaining weight.  Patient may benefit from GLP-1 agonist, however the patient is uninsured and this would be prohibitively expensive for her. - Counseled patient on lifestyle modifications to reduce weight gain

## 2024-03-22 NOTE — Progress Notes (Signed)
 Internal Medicine Clinic Attending  I was physically present during the key portions of the resident provided service and participated in the medical decision making of patient's management care. I reviewed pertinent patient test results.  The assessment, diagnosis, and plan were formulated together and I agree with the documentation in the resident's note.  Carney Living, MD

## 2024-05-28 ENCOUNTER — Other Ambulatory Visit: Payer: Self-pay

## 2024-05-28 ENCOUNTER — Other Ambulatory Visit: Payer: Self-pay | Admitting: Student

## 2024-05-28 ENCOUNTER — Ambulatory Visit: Payer: Self-pay

## 2024-05-28 DIAGNOSIS — J4540 Moderate persistent asthma, uncomplicated: Secondary | ICD-10-CM

## 2024-05-28 MED ORDER — ALBUTEROL SULFATE HFA 108 (90 BASE) MCG/ACT IN AERS
2.0000 | INHALATION_SPRAY | Freq: Four times a day (QID) | RESPIRATORY_TRACT | 3 refills | Status: AC | PRN
  Filled 2024-05-28: qty 6.7, 25d supply, fill #0
  Filled 2024-06-30: qty 6.7, 25d supply, fill #1
  Filled 2024-08-06: qty 6.7, 25d supply, fill #2

## 2024-05-28 MED ORDER — FLUTICASONE-SALMETEROL 45-21 MCG/ACT IN AERO
2.0000 | INHALATION_SPRAY | Freq: Two times a day (BID) | RESPIRATORY_TRACT | 12 refills | Status: AC
  Filled 2024-05-28 – 2024-06-30 (×2): qty 12, 30d supply, fill #0

## 2024-05-28 NOTE — Telephone Encounter (Signed)
 FYI Only or Action Required?: Action required by provider: request for appointment and medication refill request.  Patient was last seen in primary care on 03/21/2024 by Waymond Cart, MD.  Called Nurse Triage reporting Shortness of Breath.  Symptoms began several months ago.  Interventions attempted: Nothing.  Symptoms are: gradually worsening.  Triage Disposition: See PCP Within 2 Weeks  Patient/caregiver understands and will follow disposition?: Yes     Copied from CRM [id number]. Topic: Clinical - Red Word Triage >> May 28, 2024 11:00 AM Mercer PEDLAR wrote: Red Word that prompted transfer to Nurse Triage: Patient stated she's been having difficulty breathing due to not having her inhaler and is calling to request a refill. Reason for Disposition  [1] MILD longstanding difficulty breathing (e.g., minimal/no SOB at rest, SOB with walking, pulse < 100) AND [2] SAME as normal  Answer Assessment - Initial Assessment Questions Patient states she has had mild difficulty breathing due to not having her inhaler. Patient states she does not have insurance currently and states she was unable to get medication since May. Patient requesting a refill of the medication and states she has someone to get medication for her. Patient states she has one inhaler currently that is not functioning and needs a replacement one.  This RN attempted to schedule patient in office no availability until November.     1. RESPIRATORY STATUS: Describe your breathing? (e.g., wheezing, shortness of breath, unable to speak, severe coughing)      Mild  2. ONSET: When did this breathing problem begin?      May  3. PATTERN Does the difficult breathing come and go, or has it been constant since it started?      Comes and goes  4. SEVERITY: How bad is your breathing? (e.g., mild, moderate, severe)      Mild  5. RECURRENT SYMPTOM: Have you had difficulty breathing before? If Yes, ask: When was the last time? and  What happened that time?      Yes   6. LUNG HISTORY: Do you have any history of lung disease?  (e.g., pulmonary embolus, asthma, emphysema)     Asthma  7. CAUSE: What do you think is causing the breathing problem?      Not having medication  8. OTHER SYMPTOMS: Do you have any other symptoms? (e.g., chest pain, cough, dizziness, fever, runny nose)     Cough  Protocols used: Breathing Difficulty-A-AH

## 2024-05-28 NOTE — Telephone Encounter (Signed)
 Patient returned Shelby Pittman call. Message has been given to the patient. Patient stated she will call the pharmacy.

## 2024-05-28 NOTE — Telephone Encounter (Signed)
 Call to patient message left that she has refills on her inhalers.  Call the Clinics if a problem getting.

## 2024-05-29 ENCOUNTER — Other Ambulatory Visit: Payer: Self-pay

## 2024-06-06 ENCOUNTER — Other Ambulatory Visit: Payer: Self-pay

## 2024-06-30 ENCOUNTER — Other Ambulatory Visit: Payer: Self-pay

## 2024-06-30 ENCOUNTER — Other Ambulatory Visit (HOSPITAL_COMMUNITY): Payer: Self-pay

## 2024-08-06 ENCOUNTER — Other Ambulatory Visit (HOSPITAL_COMMUNITY): Payer: Self-pay
